# Patient Record
Sex: Male | Born: 1970 | Race: White | Hispanic: No | Marital: Married | Smoking: Never smoker
Health system: Southern US, Community
[De-identification: ages and names within clinical notes are randomized; demographics above are authoritative.]

## PROBLEM LIST (undated history)

## (undated) DIAGNOSIS — C801 Malignant (primary) neoplasm, unspecified: Secondary | ICD-10-CM

## (undated) DIAGNOSIS — K219 Gastro-esophageal reflux disease without esophagitis: Secondary | ICD-10-CM

## (undated) DIAGNOSIS — M1990 Unspecified osteoarthritis, unspecified site: Secondary | ICD-10-CM

## (undated) HISTORY — DX: Malignant (primary) neoplasm, unspecified: C80.1

---

## 2002-07-10 ENCOUNTER — Emergency Department (HOSPITAL_COMMUNITY): Admission: EM | Admit: 2002-07-10 | Discharge: 2002-07-10 | Payer: Self-pay | Admitting: Emergency Medicine

## 2002-07-10 ENCOUNTER — Encounter: Payer: Self-pay | Admitting: Emergency Medicine

## 2002-07-14 ENCOUNTER — Ambulatory Visit (HOSPITAL_COMMUNITY): Admission: RE | Admit: 2002-07-14 | Discharge: 2002-07-14 | Payer: Self-pay | Admitting: Urology

## 2002-07-14 ENCOUNTER — Encounter: Payer: Self-pay | Admitting: Urology

## 2004-10-21 DIAGNOSIS — C801 Malignant (primary) neoplasm, unspecified: Secondary | ICD-10-CM

## 2004-10-21 HISTORY — DX: Malignant (primary) neoplasm, unspecified: C80.1

## 2004-10-21 HISTORY — PX: NEPHRECTOMY: SHX65

## 2005-04-29 ENCOUNTER — Inpatient Hospital Stay (HOSPITAL_COMMUNITY): Admission: RE | Admit: 2005-04-29 | Discharge: 2005-05-05 | Payer: Self-pay | Admitting: Urology

## 2005-04-29 ENCOUNTER — Encounter (INDEPENDENT_AMBULATORY_CARE_PROVIDER_SITE_OTHER): Payer: Self-pay | Admitting: *Deleted

## 2005-08-05 ENCOUNTER — Ambulatory Visit (HOSPITAL_COMMUNITY): Admission: RE | Admit: 2005-08-05 | Discharge: 2005-08-05 | Payer: Self-pay | Admitting: Urology

## 2006-02-04 ENCOUNTER — Ambulatory Visit (HOSPITAL_COMMUNITY): Admission: RE | Admit: 2006-02-04 | Discharge: 2006-02-04 | Payer: Self-pay | Admitting: Urology

## 2007-04-07 ENCOUNTER — Ambulatory Visit (HOSPITAL_COMMUNITY): Admission: RE | Admit: 2007-04-07 | Discharge: 2007-04-07 | Payer: Self-pay | Admitting: Urology

## 2008-08-12 ENCOUNTER — Ambulatory Visit (HOSPITAL_COMMUNITY): Admission: RE | Admit: 2008-08-12 | Discharge: 2008-08-12 | Payer: Self-pay | Admitting: Emergency Medicine

## 2009-04-04 ENCOUNTER — Encounter: Admission: RE | Admit: 2009-04-04 | Discharge: 2009-04-04 | Payer: Self-pay | Admitting: Orthopedic Surgery

## 2010-11-11 ENCOUNTER — Encounter: Payer: Self-pay | Admitting: Emergency Medicine

## 2011-03-08 NOTE — H&P (Signed)
NAMESANTIEL, TOPPER NO.:  [id number]   MEDICAL RECORD NO.:  [id number]          PATIENT TYPE:  INP   LOCATION:  0004                         FACILITY:  [facility]   PHYSICIAN:  Bertram Millard. Dahlstedt, M.D.DATE OF BIRTH:  [birth year]   DATE OF ADMISSION:  04/29/2005  DATE OF DISCHARGE:                                HISTORY & PHYSICAL   CHIEF COMPLAINT:  Right renal tumor.   BRIEF HISTORY:  Brett [name] is a young adult male who originally presented 3 years  ago with right flank pain.  At that point he had a CT scan performed in the  ER which found the patient to have a right upper pole mass 5-6 cm in size,  having characteristics of a simple cyst.  He was reassured and had not been  seen until recently.  He represented with right flank pain and gross  hematuria.  At that time a follow up CT scan revealed a 7 cm, now solid  appearing (very slightly enhancing) right upper pole mass. It was felt that  this was a solid neoplasm.  Metastatic survey was essentially negative. He  did have 1 area in the right renal hilar area which appeared to be a  slightly enlarged lymph node.   He presents, at this time, for a right nephrectomy, and lymph node sampling.  He is aware of risks and complications and desires to proceed.   PAST MEDICAL HISTORY:  Significant for arthroscopic surgery 4 years ago.   MEDICATIONS:  He is not on any medications.   ALLERGIES:  Denies any drug allergies.   SOCIAL HISTORY:  The patient is married and has 1 young child.  His wife is  pregnant with their second.  He denies tobacco or alcohol use.  He is a  native of [place].  He is a Optician, dispensing.   FAMILY HISTORY:  Significant for heart disease, hypertension, prostate  cancer, and diabetes.   REVIEW OF SYSTEMS:  Significant for some right flank pain and intermittent  hematuria, otherwise unremarkable.   PHYSICAL EXAMINATION:  GENERAL:  Exam revealed a robust, pleasant, adult  male.  VITAL SIGNS:  All  normal.  HEENT:  Normal.  NECK:  Supple without thyromegaly or adenopathy.  CHEST:  Clear.  HEART:  Normal rate and rhythm.  ABDOMEN:  Flat, soft, nondistended, nontender.  No masses, no organomegaly.  Bladder not palpable.  He had no CVA tenderness.  No flank masses were  noted.  GENITOURINARY:  Phalanx is normal without lesions, no fibrotic areas or  plaques.  Glans normal.  Meatus normal location and size without blood or  discharge.  Scrotal skin normal.  Testicles normal shape, size and  consistency, distended bilaterally.  Cord and epididymal structures normal.  No inguinal hernias.  No peripheral edema.  NEUROLOGIC EXAM:  Grossly intact.   CBC was normal.  CMET was normal, performed recently at the office.  A CT of  the chest revealed no significant pulmonary nodules, no evidence of  metastatic disease.   IMPRESSION:  A large right upper pole renal mass, suspicious for  carcinoma.   PLAN:  Will admit for right radical nephrectomy, lymph node sampling size.       SMD/MEDQ  D:  04/29/2005  T:  04/29/2005  Job:  [id number]

## 2011-03-08 NOTE — Discharge Summary (Signed)
Brett, [name] NO.:  [id number]   MEDICAL RECORD NO.:  [id number]          PATIENT TYPE:  INP   LOCATION:  1419                         FACILITY:  [facility]   PHYSICIAN:  Brett [name], M.D.DATE OF BIRTH:  05-Oct-1971   DATE OF ADMISSION:  04/29/2005  DATE OF DISCHARGE:  05/05/2005                                 DISCHARGE SUMMARY   ADMISSION DIAGNOSIS:  Right renal mass.   DISCHARGE DIAGNOSIS:  Right renal mass.   PROCEDURE:  Right radical nephrectomy.   SURGEON:  Brett [name], M.D.   CONSULTATIONS:  None.   HISTORY OF PRESENT ILLNESS:  This is a gentleman, who presented initially  with abdominal pain.  He was found to have a right upper pole mass with  characteristics of a simple cyst.  He then represented with gross hematuria  and was seen to have a 7-cm enhancing right upper pole mass, worrisome for  malignancy.  The patient underwent a negative metastatic workup.  However,  there was some lymphadenopathy in the right renal hilar region.  Because of  this, he has elected to undergo open nephroureterectomy with lymph node  dissection.   HOSPITAL COURSE:  On 04/29/2005, the patient was taken to the operating room  and underwent the above-named procedure, which he tolerated without  complication.  For complete description of this procedure, please consult  the operative note.  Postoperatively, he was transferred to the floor, where  he remained throughout his hospital stay, which was uncomplicated, with  progressive toleration of ambulation, diet, and toleration of pain.  On  05/04/2005, it was determined that the patient was in stable condition to be  discharged home.   EXAMINATION AT THE TIME OF DISCHARGE:  ABDOMEN:  Positive bowel sounds,  soft, and nontender to palpation, without  tenderness.  The incision is  clean, dry, and intact.  Staples are in place, without surrounding erythema  or exudate.   For remainder of history and  physical, please consult the admission H&P, as  it is unchanged.   DISCHARGE INSTRUCTIONS:  The patient was instructed not to drive for the  next two weeks or while on narcotic pain medications.  He was instructed to  not strain for the next six weeks.  He was given routine wound care  instructions, and instructed to call or return if he begins to experience  fevers, chills, nausea, vomiting, redness, or drainage from his wound.  He  understands and agrees with these instructions.   DISCHARGE MEDICATIONS:  1.  Vicodin.  2.  Colace.     ______________________________  Glade Nurse, MD      Brett [name], M.D.  Electronically Signed    MT/MEDQ  D:  06/27/2005  T:  06/27/2005  Job:  [id number]

## 2011-03-08 NOTE — Op Note (Signed)
NAMERUFORD, DUDZINSKI NO.:  [id number]   MEDICAL RECORD NO.:  [id number]          PATIENT TYPE:  INP   LOCATION:  0004                         FACILITY:  [facility]   PHYSICIAN:  Bertram Millard. Dahlstedt, M.D.DATE OF BIRTH:  1971/01/07   DATE OF PROCEDURE:  DATE OF DISCHARGE:                                 OPERATIVE REPORT   PREOPERATIVE DIAGNOSES:  1.  Right renal mass.  2.  Right pericaval adenopathy.   POSTOPERATIVE DIAGNOSES:  1.  Right renal mass.  2.  Right pericaval adenopathy.   PROCEDURE:  Right radical nephrectomy.   SURGEON:  Bertram Millard. Dahlstedt, M.D.   ASSISTANT:  Ivette Loyal.   ANESTHESIA:  General endotracheal.   SPECIMENS:  1.  Right kidney and Gerota's fascia.  2.  Right adrenal gland.  3.  Right pericaval node.   DESCRIPTION OF PROCEDURE:  The patient was identified by his wrist bracelet  and brought to room #11 where he received preoperative antibiotics and was  administered general anesthesia.  He was prepped and draped in the usual  sterile fashion.   A 15-cm right subcostal incision was made which was carried down through  dermis and subcutaneous fat with Bovie cautery.  The fascia over the  external oblique was identified and divided from just lateral of the rectus  fascia to the lateral apex of the incision.  We identified and divided the  external oblique, the underlying fascia and muscle body for the internal  oblique, and fascia and underlying muscle body of the transversus abdominis.  The transversalis fascia and peritoneum were identified and opened along the  above-described incision.  Following this, retractors were set, and we  identified the white line of Toldt and resected the right hemicolon.  We  incised this white line from the hepatic flexure down to the inferior  portion of our incision.  We then were able to easily mobilize the colon  medially, and it was packed away.  We next could palpate the kidney and the  upper pole mass.  Following this, we opened Gerota's fascia and bluntly  dissected a plane, leaving adequate perianal fat.  We dissected bluntly, a  plane around the lower pole posterior aspect of the kidney down to the  psoas.  The ureter was identified and ligated with Weck clips and divided  sharply.  We followed the ureter up to the hilum.  We incised the mesotunnel  attachments just lateral to the second portion of the duodenum and dissected  down to the hilum.  The single renal vein was identified and controlled with  a vessel loop.  Following this, we dissected the remainder of the hilum and  found two renal arteries.  These were each ligated with two Weck clips on  the aorta side and one Weck clip on the kidney side.  These were then  divided.  Following this, the renal vein was divided with two silk ties and  a Weck clip and divided sharply.  Now with the kidney being freed at the  hilum, using blunt and sharp dissection, we  dissected out the upper pole and  perirenal fat.  The kidney was then passed off as a specimen.  We then noted  that we had dissected the plane between the kidney and the adrenal gland,  and we noted the remaining adrenal gland.  We then dissected out the adrenal  gland and ligated its artery with Weck clips.  It was divided sharply and  passed off the field for pathologic analysis.  Following this, we palpated  the fossa and the lymph node that had been seen on preoperative imaging that  was noted at the level of the hilum.  It was dissected sharply.  All  vascular and lymph channels were ligated with Weck clips, and the specimen  was passed off the field.  We next inspected the hilum and found excellent  hemostasis.  We did place Surgicel at the hilum for several minutes.  When  reinspected, hemostasis remained excellent.  We then proceeded with the  sponge count which was correct.  We removed all retractors and then  proceeded to close which was done in three  layers.  The posterior sheath was  closed with a running #1 PDS.  Next, the anterior sheath was closed, again  with a running #1 PDS.  The wound was copiously irrigated, and the skin was  closed with staples.  A dressing was applied.   The patient was reversed from his anesthesia which he tolerated without  complication.  Please note, Dr. Marcine Matar was present and  participated in all aspects of this case.       EG/MEDQ  D:  04/29/2005  T:  04/29/2005  Job:  [id number]

## 2012-08-23 ENCOUNTER — Ambulatory Visit (INDEPENDENT_AMBULATORY_CARE_PROVIDER_SITE_OTHER): Payer: BC Managed Care – PPO | Admitting: Physician Assistant

## 2012-08-23 VITALS — BP 130/94 | HR 93 | Temp 98.8°F | Resp 16 | Ht 74.0 in | Wt 285.6 lb

## 2012-08-23 DIAGNOSIS — R509 Fever, unspecified: Secondary | ICD-10-CM

## 2012-08-23 DIAGNOSIS — J029 Acute pharyngitis, unspecified: Secondary | ICD-10-CM

## 2012-08-23 LAB — POCT CBC
Granulocyte percent: 68 %G (ref 37–80)
HCT, POC: 53.1 % (ref 43.5–53.7)
Hemoglobin: 16.7 g/dL (ref 14.1–18.1)
Lymph, poc: 2 (ref 0.6–3.4)
MCH, POC: 27.6 pg (ref 27–31.2)
MCHC: 31.5 g/dL — AB (ref 31.8–35.4)
MCV: 87.7 fL (ref 80–97)
MID (cbc): 0.6 (ref 0–0.9)
MPV: 9.6 fL (ref 0–99.8)
POC Granulocyte: 5.6 (ref 2–6.9)
POC MID %: 7.3 %M (ref 0–12)
Platelet Count, POC: 291 10*3/uL (ref 142–424)
RBC: 6.05 M/uL (ref 4.69–6.13)
WBC: 8.2 10*3/uL (ref 4.6–10.2)

## 2012-08-23 MED ORDER — AMOXICILLIN 875 MG PO TABS: 875.0000 mg | ORAL_TABLET | Freq: Two times a day (BID) | ORAL | Status: DC

## 2012-08-23 MED ORDER — AMBULATORY NON FORMULARY MEDICATION: Status: DC

## 2012-08-23 NOTE — Progress Notes (Signed)
Subjective:    Patient ID: Brett [name], male    DOB: 04-14-1971, 41 y.o.   MRN: [id number]  HPI This 41 y.o. male presents for evaluation of sore throat.  Symptoms began several days ago.  4 of his 5 children have had fever this week, but none of them have sore throat or other symptoms.  Fever, TMax 103, chills.  No GU symptoms.  Loss of appetite, but no nausea, vomiting, diarrhea.  No nasal congestion, drainage or cough.  Known strep exposure.  Review of Systems As above.  Otherwise negative.   Past Medical History  Diagnosis Date  . Cancer 2006    Renal Cell    Past Surgical History  Procedure Date  . Nephrectomy 2006    RIGHT; Renal Cell Carcinoma    Prior to Admission medications   Medication Sig Start Date End Date Taking? Authorizing Provider  Pseudoephedrine-APAP-DM (DAYQUIL PO) Take by mouth.   Yes Historical Provider, MD    No Known Allergies  History   Social History  . Marital Status: Married    Spouse Name: Jacki Cones    Number of Children: 5   Occupational History  . sales and service    Social History Main Topics  . Smoking status: Never Smoker   . Smokeless tobacco: Never Used  . Alcohol Use: No  . Drug Use: No  . Sexually Active: Yes -- Male partner(s)   No family history on file.     Objective:   Physical Exam  Blood pressure 130/94, pulse 93, temperature 98.8 F (37.1 C), temperature source Oral, resp. rate 16, height 6\' 2"  (1.88 m), weight 285 lb 9.6 oz (129.547 kg), SpO2 97.00%. Body mass index is 36.67 kg/(m^2). Well-developed, well nourished WM who is awake, alert and oriented, in NAD. HEENT: [place]/AT, PERRL, EOMI.  Sclera and conjunctiva are clear.  EAC are patent, TMs are normal in appearance. Nasal mucosa is pink and moist. OP is notable for enlarged tonsil on the RIGHT (3+) with exudate, and white patches on the LEFT soft palate.. Neck: supple, non-tender, no lymphadenopathy, thyromegaly. Heart: RRR, no murmur Lungs: normal effort,  CTA Skin: warm and dry without rash. Psychologic: good mood and appropriate affect, normal speech and behavior.  Results for orders placed in visit on 08/23/12  POCT CBC      Component Value Range   WBC 8.2  4.6 - 10.2 K/uL   Lymph, poc 2.0  0.6 - 3.4   POC LYMPH PERCENT 24.7  10 - 50 %L   MID (cbc) 0.6  0 - 0.9   POC MID % 7.3  0 - 12 %M   POC Granulocyte 5.6  2 - 6.9   Granulocyte percent 68.0  37 - 80 %G   RBC 6.05  4.69 - 6.13 M/uL   Hemoglobin 16.7  14.1 - 18.1 g/dL   HCT, POC 40.9  81.1 - 53.7 %   MCV 87.7  80 - 97 fL   MCH, POC 27.6  27 - 31.2 pg   MCHC 31.5 (*) 31.8 - 35.4 g/dL   RDW, POC 91.4     Platelet Count, POC 291  142 - 424 K/uL   MPV 9.6  0 - 99.8 fL   Rapid Strep NEGATIVE.     Assessment & Plan:   1. Pharyngitis  POCT rapid strep A, Culture, Group A Strep, amoxicillin (AMOXIL) 875 MG tablet, AMBULATORY NON FORMULARY MEDICATION  2. Fever  POCT CBC   Supportive care.  Await TC.  Monitor sons for new symptoms.

## 2012-08-23 NOTE — Patient Instructions (Signed)
Get plenty of rest and drink at least 64 ounces of water daily. 

## 2012-08-25 LAB — CULTURE, GROUP A STREP: Organism ID, Bacteria: NORMAL

## 2012-12-05 ENCOUNTER — Other Ambulatory Visit: Payer: Self-pay

## 2013-03-03 ENCOUNTER — Other Ambulatory Visit: Payer: Self-pay | Admitting: Urology

## 2013-03-03 ENCOUNTER — Ambulatory Visit (HOSPITAL_COMMUNITY)
Admission: RE | Admit: 2013-03-03 | Discharge: 2013-03-03 | Disposition: A | Discharge status: Home / Self Care | Payer: BC Managed Care – PPO | Source: Ambulatory Visit | Attending: Urology | Admitting: Urology

## 2013-03-03 DIAGNOSIS — C649 Malignant neoplasm of unspecified kidney, except renal pelvis: Secondary | ICD-10-CM

## 2013-03-06 ENCOUNTER — Encounter: Payer: Self-pay | Admitting: Physician Assistant

## 2013-03-06 DIAGNOSIS — Z85528 Personal history of other malignant neoplasm of kidney: Secondary | ICD-10-CM | POA: Insufficient documentation

## 2013-08-26 ENCOUNTER — Other Ambulatory Visit: Payer: Self-pay

## 2013-11-12 ENCOUNTER — Ambulatory Visit (INDEPENDENT_AMBULATORY_CARE_PROVIDER_SITE_OTHER): Payer: BC Managed Care – PPO | Admitting: Family Medicine

## 2013-11-12 VITALS — BP 146/90 | HR 102 | Temp 98.7°F | Resp 18 | Ht 74.0 in | Wt 295.0 lb

## 2013-11-12 DIAGNOSIS — M542 Cervicalgia: Secondary | ICD-10-CM

## 2013-11-12 MED ORDER — PREDNISONE 20 MG PO TABS: ORAL_TABLET | ORAL | Status: DC

## 2013-11-12 MED ORDER — HYDROCODONE-ACETAMINOPHEN 5-325 MG PO TABS: ORAL_TABLET | ORAL | Status: DC

## 2013-11-12 MED ORDER — METAXALONE 800 MG PO TABS: 800.0000 mg | ORAL_TABLET | Freq: Three times a day (TID) | ORAL | Status: DC

## 2013-11-12 NOTE — Patient Instructions (Signed)
Take the muscle relaxant one half to one 3 times daily  Take the prednisone 3 pills a day and 3 tomorrow for inflammation  Use the pain pills every 4-6 hours if needed for pain  Return if not improving

## 2013-11-12 NOTE — Progress Notes (Signed)
Subjective: Patient has been having problem with left cervical pain for about 10 days. Knows of no trauma. He just woke up one day with it hurting it continues to persist. He is healthy otherwise. He is the father of sons  Objective: Very tender left neck just behind the ear. Pain shoots down to his collarbone area. Good range of motion of, but on side to side tilt and rotation a good deal of pain. Motor function of arms is good.  Assessment: First cervical strain  Plan: Skelaxin 2 days of prednisone (cannot take nonsteroidals) Norco if needed for pain Return if not improving No x-rays are done today

## 2014-03-04 ENCOUNTER — Other Ambulatory Visit: Payer: Self-pay | Admitting: Urology

## 2014-03-04 ENCOUNTER — Ambulatory Visit (HOSPITAL_COMMUNITY)
Admission: RE | Admit: 2014-03-04 | Discharge: 2014-03-04 | Disposition: A | Discharge status: Home / Self Care | Payer: BC Managed Care – PPO | Source: Ambulatory Visit | Attending: Urology | Admitting: Urology

## 2014-03-04 DIAGNOSIS — C649 Malignant neoplasm of unspecified kidney, except renal pelvis: Secondary | ICD-10-CM

## 2014-04-19 ENCOUNTER — Ambulatory Visit (INDEPENDENT_AMBULATORY_CARE_PROVIDER_SITE_OTHER): Payer: BC Managed Care – PPO | Admitting: Family Medicine

## 2014-04-19 VITALS — BP 134/89 | HR 89 | Temp 98.6°F | Resp 16 | Ht 74.0 in | Wt 261.8 lb

## 2014-04-19 DIAGNOSIS — J020 Streptococcal pharyngitis: Secondary | ICD-10-CM

## 2014-04-19 DIAGNOSIS — J029 Acute pharyngitis, unspecified: Secondary | ICD-10-CM

## 2014-04-19 LAB — POCT RAPID STREP A (OFFICE): Rapid Strep A Screen: POSITIVE — AB

## 2014-04-19 MED ORDER — FIRST-DUKES MOUTHWASH MT SUSP: 5.0000 mL | OROMUCOSAL | Status: DC | PRN

## 2014-04-19 MED ORDER — AZITHROMYCIN 250 MG PO TABS: ORAL_TABLET | ORAL | Status: DC

## 2014-04-19 NOTE — Patient Instructions (Addendum)
Strep Throat Strep throat is an infection of the throat caused by a bacteria named Streptococcus pyogenes. Your caregiver may call the infection streptococcal "tonsillitis" or "pharyngitis" depending on whether there are signs of inflammation in the tonsils or back of the throat. Strep throat is most common in children aged 43-15 years during the cold months of the year, but it can occur in people of any age during any season. This infection is spread from person to person (contagious) through coughing, sneezing, or other close contact. SYMPTOMS   Fever or chills.  Painful, swollen, red tonsils or throat.  Pain or difficulty when swallowing.  White or yellow spots on the tonsils or throat.  Swollen, tender lymph nodes or "glands" of the neck or under the jaw.  Red rash all over the body (rare). DIAGNOSIS  Many different infections can cause the same symptoms. A test must be done to confirm the diagnosis so the right treatment can be given. A "rapid strep test" can help your caregiver make the diagnosis in a few minutes. If this test is not available, a light swab of the infected area can be used for a throat culture test. If a throat culture test is done, results are usually available in a day or two. TREATMENT  Strep throat is treated with antibiotic medicine. HOME CARE INSTRUCTIONS   Gargle with 1 tsp of salt in 1 cup of warm water, 3-4 times per day or as needed for comfort.  Family members who also have a sore throat or fever should be tested for strep throat and treated with antibiotics if they have the strep infection.  Make sure everyone in your household washes their hands well.  Do not share food, drinking cups, or personal items that could cause the infection to spread to others.  You may need to eat a soft food diet until your sore throat gets better.  Drink enough water and fluids to keep your urine clear or pale yellow. This will help prevent dehydration.  Get plenty of  rest.  Stay home from school, daycare, or work until you have been on antibiotics for 24 hours.  Only take over-the-counter or prescription medicines for pain, discomfort, or fever as directed by your caregiver.  If antibiotics are prescribed, take them as directed. Finish them even if you start to feel better. SEEK MEDICAL CARE IF:   The glands in your neck continue to enlarge.  You develop a rash, cough, or earache.  You cough up green, yellow-brown, or bloody sputum.  You have pain or discomfort not controlled by medicines.  Your problems seem to be getting worse rather than better. SEEK IMMEDIATE MEDICAL CARE IF:   You develop any new symptoms such as vomiting, severe headache, stiff or painful neck, chest pain, shortness of breath, or trouble swallowing.  You develop severe throat pain, drooling, or changes in your voice.  You develop swelling of the neck, or the skin on the neck becomes red and tender.  You have a fever.  You develop signs of dehydration, such as fatigue, dry mouth, and decreased urination.  You become increasingly sleepy, or you cannot wake up completely. Document Released: 10/04/2000 Document Revised: 09/23/2012 Document Reviewed: 12/06/2010 Exit[employer] Patient Information 2015 [employer]. This information is not intended to replace advice given to you by your health care provider. Make sure you discuss any questions you have with your health care provider.  

## 2014-04-19 NOTE — Progress Notes (Signed)
   Subjective:    Patient ID: Brett [name], male    DOB: [birth year], 43 y.o.   MRN: [id number]  HPI Patient has had 2 day history of sore throat. Feels like he is swallowing gravel. No one else at home sick. Patient has 5 sons, aging in range from 72-10 yo. Patient took ibuprofen last night, slept well.   Patient has lost over 30 pounds since 1/15 by watching carbohydrate intake. Reports that he has more energy.   Review of Systems No fever, no headache, no runny nose, no ear pain, some hoarseness and dry cough.    Objective:   Physical Exam  Vitals reviewed. Constitutional: He is oriented to person, place, and time. He appears well-developed and well-nourished.  HENT:  Head: Normocephalic and atraumatic.  Right Ear: Tympanic membrane, external ear and ear canal normal.  Left Ear: Tympanic membrane, external ear and ear canal normal.  Nose: Rhinorrhea present.  Mouth/Throat: Uvula is midline and mucous membranes are normal. Posterior oropharyngeal edema, posterior oropharyngeal erythema and tonsillar abscesses present. No oropharyngeal exudate.  Right tonsil +3, left tonsil +1.  Eyes: Conjunctivae are normal. Right eye exhibits no discharge. Left eye exhibits no discharge.  Neck: Normal range of motion. Neck supple.  Cardiovascular: Normal rate, regular rhythm and normal heart sounds.   Pulmonary/Chest: Effort normal.  Musculoskeletal: Normal range of motion.  Lymphadenopathy:    He has no cervical adenopathy.  Neurological: He is alert and oriented to person, place, and time.  Skin: Skin is warm and dry.  Psychiatric: He has a normal mood and affect. His behavior is normal. Judgment and thought content normal.    Results for orders placed in visit on 04/19/14  POCT RAPID STREP A (OFFICE)      Result Value Ref Range   Rapid Strep A Screen Positive (*) Negative         Assessment & Plan:  1. Acute pharyngitis, unspecified pharyngitis type - POCT rapid strep A -  Diphenhyd-Hydrocort-Nystatin (FIRST-DUKES MOUTHWASH) SUSP; Use as directed 5 mLs in the mouth or throat every 2 (two) hours as needed.  Dispense: 237 mL; Refill: 0  2. Strep pharyngitis - azithromycin (ZITHROMAX Z-PAK) 250 MG tablet; Take 2 tablets today, then 1 tablet a day for 4 days.  Dispense: 6 each; Refill: 0 -provided written and verbal instructions regarding symptomatic treatment, RTC if no improvement with antibiotic completion or if worsening symptoms. Brett Beck, FNP-BC  [facility]  04/19/2014 1:51 PM

## 2014-08-05 ENCOUNTER — Other Ambulatory Visit: Payer: Self-pay

## 2014-12-16 ENCOUNTER — Ambulatory Visit (INDEPENDENT_AMBULATORY_CARE_PROVIDER_SITE_OTHER): Payer: BLUE CROSS/BLUE SHIELD | Admitting: Internal Medicine

## 2014-12-16 VITALS — BP 130/90 | HR 94 | Temp 97.8°F | Resp 16 | Ht 73.0 in | Wt 274.0 lb

## 2014-12-16 DIAGNOSIS — M5441 Lumbago with sciatica, right side: Secondary | ICD-10-CM

## 2014-12-16 MED ORDER — TRAMADOL HCL 50 MG PO TABS: 50.0000 mg | ORAL_TABLET | Freq: Four times a day (QID) | ORAL | Status: DC | PRN

## 2014-12-16 MED ORDER — PREDNISONE 20 MG PO TABS: ORAL_TABLET | ORAL | Status: DC

## 2014-12-16 MED ORDER — CYCLOBENZAPRINE HCL 10 MG PO TABS: 10.0000 mg | ORAL_TABLET | Freq: Every day | ORAL | Status: DC

## 2014-12-16 NOTE — Progress Notes (Signed)
   Subjective:    Patient ID: Brett [name], male    DOB: Feb 15, 1971, 44 y.o.   MRN: [id number]  HPI worsening lumbar pain over the past 2 weeks after recent travel to [place] Pain mid lumbar and lower lumbar with radiation to the right lower extremity and with pain in the right anterior thigh but no numbness or weakness History of recurrent back problems like this every 1-2 years over the last several years Usually responds temporarily chiropractic Pain may return with some small stresses to the back like certain twists or sitting on airplane ride for long time He's had x-rays but no MRI  History renal cell carcinoma status post surgery one kidney remains and he should avoid NSAIDs Otherwise healthy on no meds Prior history of left shoulder surgery Married--5 boys  Review of Systems No fatigue No fever or night sweats No chest pain or palpitations No GU symptoms No other joint symptoms    Objective:   Physical Exam BP 130/90 mmHg  Pulse 94  Temp(Src) 97.8 F (36.6 C) (Oral)  Resp 16  Ht 6\' 1"  (1.854 m)  Wt 274 lb (124.286 kg)  BMI 36.16 kg/m2  SpO2 98% Overweight Neck has a good range of motion He is nontender along the parathoracic muscles but with some limited range of motion of the shoulders above 90 from general tightness Rotation flexion and extension [place] lumbar discomfort He's tender over L3-4 and 5 and a little to each side in the paralumbar area Nontender over the sciatic notches Straight leg raise on the right is positive at 30 Straight leg raise on the left is positive at 75 Deep tendon reflexes are 2+ at the patella and there is no motor or sensory loss identified       Assessment & Plan:  Acute superimposed on chronic lumbar problems with most likely etiology being chronic muscle strain I discussed the possibility of a central canal stenosis but without weakness or numbness we can proceed with physical therapy without further testing  He needs  therapy aimed at stretching and strengthening and working on the other areas in his hips and spine that provide tightness as a compensation for his lumbar problem  8 d course of prednisone Flexeril at bedtime Ultram if needed Meds ordered this encounter  Medications  . predniSONE (DELTASONE) 20 MG tablet    Sig: 4/4/3/3/2/2/1/1 single daily dose for 8 days    Dispense:  20 tablet    Refill:  0  . cyclobenzaprine (FLEXERIL) 10 MG tablet    Sig: Take 1 tablet (10 mg total) by mouth at bedtime.    Dispense:  30 tablet    Refill:  0  . traMADol (ULTRAM) 50 MG tablet    Sig: Take 1-2 tablets (50-100 mg total) by mouth every 6 (six) hours as needed.    Dispense:  120 tablet    Refill:  0    Refer to [facility]

## 2015-04-12 ENCOUNTER — Ambulatory Visit (INDEPENDENT_AMBULATORY_CARE_PROVIDER_SITE_OTHER): Payer: BLUE CROSS/BLUE SHIELD | Admitting: Physician Assistant

## 2015-04-12 VITALS — BP 132/106 | HR 102 | Temp 97.6°F | Resp 18 | Ht 73.0 in | Wt 279.0 lb

## 2015-04-12 DIAGNOSIS — R030 Elevated blood-pressure reading, without diagnosis of hypertension: Secondary | ICD-10-CM | POA: Diagnosis not present

## 2015-04-12 DIAGNOSIS — Z85528 Personal history of other malignant neoplasm of kidney: Secondary | ICD-10-CM | POA: Diagnosis not present

## 2015-04-12 DIAGNOSIS — R21 Rash and other nonspecific skin eruption: Secondary | ICD-10-CM

## 2015-04-12 DIAGNOSIS — M542 Cervicalgia: Secondary | ICD-10-CM | POA: Diagnosis not present

## 2015-04-12 DIAGNOSIS — IMO0001 Reserved for inherently not codable concepts without codable children: Secondary | ICD-10-CM

## 2015-04-12 MED ORDER — TRAMADOL HCL 50 MG PO TABS
50.0000 mg | ORAL_TABLET | Freq: Four times a day (QID) | ORAL | Status: DC | PRN
Start: 2015-04-12 — End: 2021-02-11

## 2015-04-12 MED ORDER — CLOTRIMAZOLE-BETAMETHASONE 1-0.05 % EX CREA: 1.0000 "application " | TOPICAL_CREAM | Freq: Two times a day (BID) | CUTANEOUS | Status: DC

## 2015-04-12 MED ORDER — PREDNISONE 20 MG PO TABS: ORAL_TABLET | ORAL | Status: DC

## 2015-04-12 MED ORDER — CYCLOBENZAPRINE HCL 10 MG PO TABS: 10.0000 mg | ORAL_TABLET | Freq: Every day | ORAL | Status: DC

## 2015-04-12 NOTE — Progress Notes (Signed)
04/14/2015 at 10:27 AM  Brett [name] / DOB: 1971/01/21 / MRN: [id number]  The patient has History of Renal Cell Carcinoma on his problem list.  SUBJECTIVE  Chief complaint: Neck Pain and Rash   Patient here with chief complaint of left neck.  Started 4 days ago as "a crick in the neck" and has worsened.  Has some radiation of the pain to the left shoulder.  Has tried ibuprofen 600 mg bid and tylenol 500 with good relief of the pain early in the illness, but little to no pain reduction today.  Denies weakness, left hand paraesthesia, and incontinence.   Patient reports that he has a history of white coat hypertension.  He checks his BP at home and reports that it runs around 130/85. He is amenable to checking his BP often and contacting the office if it is running greater than 140/90 consistently.   He complains of a rash on the medial upper thighs.  It started roughly 1 weeks ago and he reports the rash burns somewhat when he is ambulating and sweating.  He has had this problem before. He denies pain with the rash and denies itching.   He  has a past medical history of Cancer (2006).    Medications reviewed and updated by myself where necessary, and exist elsewhere in the encounter.   Brett [name] has No Known Allergies. He  reports that he has never smoked. He has never used smokeless tobacco. He reports that he does not drink alcohol or use illicit drugs. He  reports that he currently engages in sexual activity and has had male partners. The patient  has past surgical history that includes Nephrectomy (2006).  His family history is not on file.  Review of Systems  Constitutional: Negative for fever.  Respiratory: Negative for cough.   Cardiovascular: Negative for chest pain.  Gastrointestinal: Negative for nausea.  Genitourinary: Negative for urgency and frequency.  Musculoskeletal: Negative for myalgias.  Skin: Positive for rash. Negative for itching.  Neurological: Negative for  dizziness and headaches.    OBJECTIVE  His  height is 6\' 1"  (1.854 m) and weight is 279 lb (126.554 kg). His oral temperature is 97.6 F (36.4 C). His blood pressure is 132/106 and his pulse is 102. His respiration is 18 and oxygen saturation is 97%.  The patient's body mass index is 36.82 kg/(m^2).  Physical Exam  Vitals reviewed. Constitutional: He appears well-developed and well-nourished. No distress.  Skin: Skin is warm and dry. Rash noted.       No results found for this or any previous visit (from the past 24 hour(s)).  ASSESSMENT & PLAN  Brett [name] was seen today for neck pain and rash.  Diagnoses and all orders for this visit:  Neck pain on right side Orders: -     cyclobenzaprine (FLEXERIL) 10 MG tablet; Take 1 tablet (10 mg total) by mouth at bedtime. -     predniSONE (DELTASONE) 20 MG tablet; 4/4/3/3/2/2/1/1 single daily dose for 8 days -     traMADol (ULTRAM) 50 MG tablet; Take 1-2 tablets (50-100 mg total) by mouth every 6 (six) hours as needed.  Rash and nonspecific skin eruption: His rash is consistent with mild abrasion, and appears to be secondary to ambulation and large body habitus.  Anticipatory guidance provided.   Orders: -     clotrimazole-betamethasone (LOTRISONE) cream; Apply 1 application topically 2 (two) times daily.  History of renal cell carcinoma Orders: -  Comprehensive metabolic panel  Elevated BP:  Advised patient to come back to clinic if his BP is consistently measuring higher than 140/90.      The patient was advised to call or come back to clinic if he does not see an improvement in symptoms, or worsens with the above plan.   Philis Fendt, MHS, PA-C [facility] 04/14/2015 10:27 AM

## 2015-04-12 NOTE — Patient Instructions (Signed)
Monitor your BP at home and if it is consistently above 140/90 please come in for a visit.

## 2015-04-13 LAB — COMPREHENSIVE METABOLIC PANEL
ALBUMIN: 4.5 g/dL (ref 3.5–5.2)
ALT: 15 U/L (ref 0–53)
AST: 16 U/L (ref 0–37)
Alkaline Phosphatase: 59 U/L (ref 39–117)
BUN: 22 mg/dL (ref 6–23)
CO2: 23 mEq/L (ref 19–32)
Calcium: 9.3 mg/dL (ref 8.4–10.5)
Chloride: 106 mEq/L (ref 96–112)
Creat: 1.2 mg/dL (ref 0.50–1.35)
GLUCOSE: 92 mg/dL (ref 70–99)
Potassium: 4.3 mEq/L (ref 3.5–5.3)
Sodium: 142 mEq/L (ref 135–145)
Total Bilirubin: 0.6 mg/dL (ref 0.2–1.2)
Total Protein: 7.5 g/dL (ref 6.0–8.3)

## 2015-04-17 NOTE — Progress Notes (Signed)
  Medical screening examination/treatment/procedure(s) were performed by non-physician practitioner and as supervising physician I was immediately available for consultation/collaboration.     

## 2015-04-19 ENCOUNTER — Ambulatory Visit (INDEPENDENT_AMBULATORY_CARE_PROVIDER_SITE_OTHER): Payer: BLUE CROSS/BLUE SHIELD | Admitting: Family Medicine

## 2015-04-19 ENCOUNTER — Ambulatory Visit (INDEPENDENT_AMBULATORY_CARE_PROVIDER_SITE_OTHER): Payer: BLUE CROSS/BLUE SHIELD

## 2015-04-19 VITALS — BP 152/94 | HR 91 | Temp 98.5°F | Resp 18 | Ht 73.0 in | Wt 279.0 lb

## 2015-04-19 DIAGNOSIS — M5010 Cervical disc disorder with radiculopathy, unspecified cervical region: Secondary | ICD-10-CM

## 2015-04-19 DIAGNOSIS — R200 Anesthesia of skin: Secondary | ICD-10-CM

## 2015-04-19 DIAGNOSIS — M542 Cervicalgia: Secondary | ICD-10-CM

## 2015-04-19 MED ORDER — METHOCARBAMOL 500 MG PO TABS: 500.0000 mg | ORAL_TABLET | Freq: Three times a day (TID) | ORAL | Status: DC | PRN

## 2015-04-19 NOTE — Patient Instructions (Signed)
Cervical Radiculopathy  Cervical radiculopathy happens when a nerve in the neck is pinched or bruised by a slipped (herniated) disk or by arthritic changes in the bones of the cervical spine. This can occur due to an injury or as part of the normal aging process. Pressure on the cervical nerves can cause pain or numbness that runs from your neck all the way down into your arm and fingers.  CAUSES   There are many possible causes, including:   Injury.   Muscle tightness in the neck from overuse.   Swollen, painful joints (arthritis).   Breakdown or degeneration in the bones and joints of the spine (spondylosis) due to aging.   Bone spurs that may develop near the cervical nerves.  SYMPTOMS   Symptoms include pain, weakness, or numbness in the affected arm and hand. Pain can be severe or irritating. Symptoms may be worse when extending or turning the neck.  DIAGNOSIS   Your caregiver will ask about your symptoms and do a physical exam. He or she may test your strength and reflexes. X-rays, CT scans, and MRI scans may be needed in cases of injury or if the symptoms do not go away after a period of time. Electromyography (EMG) or nerve conduction testing may be done to study how your nerves and muscles are working.  TREATMENT   Your caregiver may recommend certain exercises to help relieve your symptoms. Cervical radiculopathy can, and often does, get better with time and treatment. If your problems continue, treatment options may include:   Wearing a soft collar for short periods of time.   Physical therapy to strengthen the neck muscles.   Medicines, such as nonsteroidal anti-inflammatory drugs (NSAIDs), oral corticosteroids, or spinal injections.   Surgery. Different types of surgery may be done depending on the cause of your problems.  HOME CARE INSTRUCTIONS    Put ice on the affected area.   Put ice in a plastic bag.   Place a towel between your skin and the bag.   Leave the ice on for 15-20 minutes,  03-04 times a day or as directed by your caregiver.   If ice does not help, you can try using heat. Take a warm shower or bath, or use a hot water bottle as directed by your caregiver.   You may try a gentle neck and shoulder massage.   Use a flat pillow when you sleep.   Only take over-the-counter or prescription medicines for pain, discomfort, or fever as directed by your caregiver.   If physical therapy was prescribed, follow your caregiver's directions.   If a soft collar was prescribed, use it as directed.  SEEK IMMEDIATE MEDICAL CARE IF:    Your pain gets much worse and cannot be controlled with medicines.   You have weakness or numbness in your hand, arm, face, or leg.   You have a high fever or a stiff, rigid neck.   You lose bowel or bladder control (incontinence).   You have trouble with walking, balance, or speaking.  MAKE SURE YOU:    Understand these instructions.   Will watch your condition.   Will get help right away if you are not doing well or get worse.  Document Released: 07/02/2001 Document Revised: 12/30/2011 Document Reviewed: 05/21/2011  ExitCare Patient Information 2015 [employer]. This information is not intended to replace advice given to you by your health care provider. Make sure you discuss any questions you have with your health care provider.

## 2015-04-19 NOTE — Progress Notes (Signed)
Chief Complaint:  Chief Complaint  Patient presents with  . Follow-up    neck pain    HPI: Brett [name] is a 44 y.o. male who is here for check of his right neck pain. He was on a sterilely pack, Flexeril, tramadol. His pain is completely gone for the most part. But he has had some numbness. The numbness is only on the front of his shoulder and also along the SCM.  He has no rashes. He has no history of shingles. He has had no fevers or chills. He has no facial swelling or shortness of breath. 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  Past Medical History  Diagnosis Date  . Cancer 2006    Renal Cell   Past Surgical History  Procedure Laterality Date  . Nephrectomy  2006    RIGHT; Renal Cell Carcinoma   History   Social History  . Marital Status: Married    Spouse Name: Margarita Grizzle  . Number of Children: 5  . Years of Education: N/A   Occupational History  . sales and service   . youth leader     [employer]   Social History Main Topics  . Smoking status: Never Smoker   . Smokeless tobacco: Never Used  . Alcohol Use: No  . Drug Use: No  . Sexual Activity:    Partners: Female   Other Topics Concern  . None   Social History Narrative   Lives with his wife and their 5 sons.  His wife home-schools the boys.   No family history on file. No Known Allergies Prior to Admission medications   Medication Sig Start Date End Date Taking? Authorizing Provider  cyclobenzaprine (FLEXERIL) 10 MG tablet Take 1 tablet (10 mg total) by mouth at bedtime. Patient not taking: Reported on 04/19/2015 04/12/15   Tereasa Coop, PA-C  traMADol (ULTRAM) 50 MG tablet Take 1-2 tablets (50-100 mg total) by mouth every 6 (six) hours as needed. Patient not taking: Reported on 04/19/2015 04/12/15   Tereasa Coop, PA-C     ROS: The patient denies fevers, chills, night sweats, unintentional weight loss, chest pain,  palpitations, wheezing, dyspnea on exertion, nausea, vomiting, abdominal pain, dysuria, hematuria, melena, no tingling, no weakness. All other systems have been reviewed and were otherwise negative with the exception of those mentioned in the HPI and as above.    PHYSICAL EXAM: Filed Vitals:   04/19/15 0958  BP: 152/94  Pulse: 91  Temp: 98.5 F (36.9 C)  Resp: 18   Filed Vitals:   04/19/15 0958  Height: 6\' 1"  (1.854 m)  Weight: 279 lb (126.554 kg)   Body mass index is 36.82 kg/(m^2).   General: Alert, no acute distress HEENT:  Normocephalic, atraumatic, oropharynx patent. EOMI, PERRLA Cardiovascular:  Regular rate and rhythm, no rubs murmurs or gallops.  No Carotid bruits, radial pulse intact. No pedal edema.  Respiratory: Clear to auscultation bilaterally.  No wheezes, rales, or rhonchi.  No cyanosis, no use of accessory musculature GI: No organomegaly, abdomen is soft and non-tender, positive bowel sounds.  No masses. Skin: No rashes. Neurologic: Facial musculature symmetric. Psychiatric: Patient is appropriate throughout our interaction. Lymphatic: No cervical lymphadenopathy Musculoskeletal: Gait intact. C spine- + Right SCM paramsk tenderness  On rotation,  Full ROM 5/5 strength, 2/2 DTRs Negative Spurling Shoulder exam was normal 5 out of 5 upper extremity strength  Decreased sensation in C4-C5 dermatome.      LABS: Results for orders placed or performed in visit on 04/12/15  Comprehensive metabolic panel  Result Value Ref Range   Sodium 142 135 - 145 mEq/L   Potassium 4.3 3.5 - 5.3 mEq/L   Chloride 106 96 - 112 mEq/L   CO2 23 19 - 32 mEq/L   Glucose, Bld 92 70 - 99 mg/dL   BUN 22 6 - 23 mg/dL   Creat 1.20 0.50 - 1.35 mg/dL   Total Bilirubin 0.6 0.2 - 1.2 mg/dL   Alkaline Phosphatase 59 39 - 117 U/L   AST 16 0 - 37 U/L   ALT 15 0 - 53 U/L   Total Protein 7.5 6.0 - 8.3 g/dL   Albumin 4.5 3.5 - 5.2 g/dL   Calcium 9.3 8.4 - 10.5 mg/dL     EKG/XRAY:     Primary read interpreted by Dr. Marin Comment at [facility]. + minimal djd , please comment if there is anything at C4-5   ASSESSMENT/PLAN: Encounter Diagnoses  Name Primary?  . Neck pain on right side Yes  . Numbness   . Cervical disc disorder with radiculopathy of cervical region    This is probably just a sprain/strain of his cervical spine muscles. There is probably some inflammation since he has lost the normal curvature of his C-spine. He may  have minimal DJD of the cervical spine. He has C4-C5 cervical dermatome patterns of numbness. There are no other symptoms. We will continue to monitor this. He has decreased pain from this sterile dose pack. He is not even taking Flexeril or tramadol since they're too strong. We will consider switching his Flexeril to Robaxin to see if it has less side effects of dizziness. I have asked him to be mindful that if there is any rashes that occur or if he has worsening pain he needs to let us know because this may be early onset shingles. This is less likely on the differential.  Gross sideeffects, risk and benefits, and alternatives of medications d/w patient. Patient is aware that all medications have potential sideeffects and we are unable to predict every sideeffect or drug-drug interaction that may occur.  Layton Tappan, [place], DO 04/19/2015 4:05 PM

## 2015-08-30 ENCOUNTER — Other Ambulatory Visit: Payer: Self-pay | Admitting: Urology

## 2015-08-30 ENCOUNTER — Ambulatory Visit (HOSPITAL_COMMUNITY)
Admission: RE | Admit: 2015-08-30 | Discharge: 2015-08-30 | Disposition: A | Discharge status: Home / Self Care | Payer: BLUE CROSS/BLUE SHIELD | Source: Ambulatory Visit | Attending: Urology | Admitting: Urology

## 2015-08-30 DIAGNOSIS — Z905 Acquired absence of kidney: Secondary | ICD-10-CM | POA: Insufficient documentation

## 2016-11-29 ENCOUNTER — Other Ambulatory Visit: Payer: Self-pay | Admitting: Orthopedic Surgery

## 2017-04-21 ENCOUNTER — Encounter (HOSPITAL_COMMUNITY): Payer: Self-pay | Admitting: Emergency Medicine

## 2017-04-21 ENCOUNTER — Emergency Department (HOSPITAL_COMMUNITY)
Admission: EM | Admit: 2017-04-21 | Discharge: 2017-04-22 | Disposition: A | Discharge status: Home / Self Care | Payer: BLUE CROSS/BLUE SHIELD | Attending: Emergency Medicine | Admitting: Emergency Medicine

## 2017-04-21 ENCOUNTER — Emergency Department (HOSPITAL_COMMUNITY): Payer: BLUE CROSS/BLUE SHIELD

## 2017-04-21 DIAGNOSIS — Z85528 Personal history of other malignant neoplasm of kidney: Secondary | ICD-10-CM | POA: Diagnosis not present

## 2017-04-21 DIAGNOSIS — R1013 Epigastric pain: Secondary | ICD-10-CM | POA: Diagnosis not present

## 2017-04-21 LAB — CBC WITH DIFFERENTIAL/PLATELET
BASOS PCT: 1 %
Basophils Absolute: 0.1 10*3/uL (ref 0.0–0.1)
Eosinophils Absolute: 0.5 10*3/uL (ref 0.0–0.7)
Eosinophils Relative: 5 %
HEMATOCRIT: 46.1 % (ref 39.0–52.0)
Hemoglobin: 15.9 g/dL (ref 13.0–17.0)
LYMPHS PCT: 32 %
Lymphs Abs: 2.9 10*3/uL (ref 0.7–4.0)
MCH: 28.5 pg (ref 26.0–34.0)
MCHC: 34.5 g/dL (ref 30.0–36.0)
MCV: 82.8 fL (ref 78.0–100.0)
MONO ABS: 0.4 10*3/uL (ref 0.1–1.0)
MONOS PCT: 5 %
NEUTROS ABS: 5.4 10*3/uL (ref 1.7–7.7)
Neutrophils Relative %: 57 %
Platelets: 252 10*3/uL (ref 150–400)
RBC: 5.57 MIL/uL (ref 4.22–5.81)
RDW: 14 % (ref 11.5–15.5)
WBC: 9.2 10*3/uL (ref 4.0–10.5)

## 2017-04-21 LAB — COMPREHENSIVE METABOLIC PANEL
ALT: 18 U/L (ref 17–63)
ANION GAP: 11 (ref 5–15)
AST: 19 U/L (ref 15–41)
Albumin: 4.2 g/dL (ref 3.5–5.0)
Alkaline Phosphatase: 70 U/L (ref 38–126)
BILIRUBIN TOTAL: 0.8 mg/dL (ref 0.3–1.2)
BUN: 18 mg/dL (ref 6–20)
CO2: 25 mmol/L (ref 22–32)
Calcium: 9 mg/dL (ref 8.9–10.3)
Chloride: 106 mmol/L (ref 101–111)
Creatinine, Ser: 1.25 mg/dL — ABNORMAL HIGH (ref 0.61–1.24)
GFR calc Af Amer: >60 mL/min (ref >60)
Glucose, Bld: 114 mg/dL — ABNORMAL HIGH (ref 65–99)
POTASSIUM: 3.6 mmol/L (ref 3.5–5.1)
Sodium: 142 mmol/L (ref 135–145)
TOTAL PROTEIN: 7.4 g/dL (ref 6.5–8.1)

## 2017-04-21 LAB — LIPASE, BLOOD: LIPASE: 33 U/L (ref 11–51)

## 2017-04-21 LAB — I-STAT TROPONIN, ED: TROPONIN I, POC: 0.01 ng/mL (ref 0.00–0.08)

## 2017-04-21 MED ORDER — SODIUM CHLORIDE 0.9 % IV BOLUS (SEPSIS)
1000.0000 mL | Freq: Once | INTRAVENOUS | Status: AC
  Administered 2017-04-21: 1000 mL via INTRAVENOUS

## 2017-04-21 MED ORDER — SUCRALFATE 1 G PO TABS
1.0000 g | ORAL_TABLET | Freq: Once | ORAL | Status: AC
  Administered 2017-04-21: 1 g via ORAL
  Filled 2017-04-21: qty 1

## 2017-04-21 MED ORDER — MORPHINE SULFATE (PF) 4 MG/ML IV SOLN
4.0000 mg | Freq: Once | INTRAVENOUS | Status: AC
  Administered 2017-04-21: 4 mg via INTRAVENOUS
  Filled 2017-04-21: qty 1

## 2017-04-21 MED ORDER — IOPAMIDOL (ISOVUE-300) INJECTION 61%
INTRAVENOUS | Status: AC
  Filled 2017-04-21: qty 100

## 2017-04-21 MED ORDER — HYDROMORPHONE HCL 1 MG/ML IJ SOLN
1.0000 mg | Freq: Once | INTRAMUSCULAR | Status: AC
  Administered 2017-04-21: 1 mg via INTRAVENOUS
  Filled 2017-04-21: qty 1

## 2017-04-21 MED ORDER — ONDANSETRON HCL 4 MG/2ML IJ SOLN
4.0000 mg | Freq: Once | INTRAMUSCULAR | Status: AC
  Administered 2017-04-21: 4 mg via INTRAVENOUS
  Filled 2017-04-21: qty 2

## 2017-04-21 MED ORDER — SIMETHICONE 80 MG PO CHEW
160.0000 mg | CHEWABLE_TABLET | Freq: Once | ORAL | Status: AC
  Administered 2017-04-21: 160 mg via ORAL
  Filled 2017-04-21: qty 2

## 2017-04-21 MED ORDER — GI COCKTAIL ~~LOC~~
30.0000 mL | Freq: Once | ORAL | Status: AC
  Administered 2017-04-21: 30 mL via ORAL
  Filled 2017-04-21: qty 30

## 2017-04-21 MED ORDER — FAMOTIDINE IN NACL 20-0.9 MG/50ML-% IV SOLN
20.0000 mg | INTRAVENOUS | Status: AC
  Administered 2017-04-21: 20 mg via INTRAVENOUS
  Filled 2017-04-21: qty 50

## 2017-04-21 MED ORDER — IOPAMIDOL (ISOVUE-300) INJECTION 61%
100.0000 mL | Freq: Once | INTRAVENOUS | Status: AC | PRN
  Administered 2017-04-21: 100 mL via INTRAVENOUS

## 2017-04-21 NOTE — ED Provider Notes (Signed)
[place] DEPT Provider Note   CSN: [id number] Arrival date & time: 04/21/17  1744     History   Chief Complaint Chief Complaint  Patient presents with  . Abdominal Pain    HPI Brett [name] is a 46 y.o. male.  The history is provided by the patient and medical records.    46 year old male with history of renal cell carcinoma status post right nephrectomy, presenting to the ED with epigastric abdominal pain. States this began a few hours ago and has been fairly constant. States is localized to the lower midsternal region without noted radiation. States pain is sharp and stabbing in nature. States the pain is severe and it makes him feel short of breath, but he denies any trouble actually breathing. He has not had any chest pain, cough, congestion, fever, chills. States he had similar symptoms in the past related to acid reflux. He did try taking some Zantac) drinking cup of water with baking soda at home which made him throw up. States she has tried to vomit again as he thinks he would feel better he vomited. He denied any recent changes in diet or abnormal food intake, states he quit drinking soda back in December. He denies any other symptoms at this time.  States when he had similar symptoms in the past GI cocktail resolved it, he had this in triage without change in symptoms.  Past Medical History:  Diagnosis Date  . Cancer [facility]) 2006   Renal Cell    Patient Active Problem List   Diagnosis Date Noted  . History of Renal Cell Carcinoma 03/06/2013    Past Surgical History:  Procedure Laterality Date  . NEPHRECTOMY  2006   RIGHT; Renal Cell Carcinoma       Home Medications    Prior to Admission medications   Medication Sig Start Date End Date Taking? Authorizing Provider  methocarbamol (ROBAXIN) 500 MG tablet Take 1 tablet (500 mg total) by mouth every 8 (eight) hours as needed for muscle spasms. 04/19/15   Le, Thao P, DO  traMADol (ULTRAM) 50 MG tablet Take 1-2  tablets (50-100 mg total) by mouth every 6 (six) hours as needed. Patient not taking: Reported on 04/19/2015 04/12/15   Tereasa Coop, PA-C    Family History No family history on file.  Social History Social History  Substance Use Topics  . Smoking status: Never Smoker  . Smokeless tobacco: Never Used  . Alcohol use No     Allergies   Patient has no known allergies.   Review of Systems Review of Systems  Gastrointestinal: Positive for abdominal pain, nausea and vomiting.  All other systems reviewed and are negative.    Physical Exam Updated Vital Signs BP (!) 183/119 (BP Location: Right Arm)   Pulse 76   Temp 98.8 F (37.1 C) (Oral)   Resp 20   SpO2 99%   Physical Exam  Constitutional: He is oriented to person, place, and time. He appears well-developed and well-nourished.  Appears uncomfortable, diaphoretic  HENT:  Head: Normocephalic and atraumatic.  Mouth/Throat: Oropharynx is clear and moist.  Eyes: Conjunctivae and EOM are normal. Pupils are equal, round, and reactive to light.  Neck: Normal range of motion.  Cardiovascular: Normal rate, regular rhythm and normal heart sounds.   Pulmonary/Chest: Effort normal and breath sounds normal. No respiratory distress. He has no wheezes. He has no rhonchi.    Endorses pain in lower mid-sternal region, no focal tenderness  Abdominal: Soft. Bowel sounds  are normal. There is no tenderness. There is no rebound.  Musculoskeletal: Normal range of motion.  Neurological: He is alert and oriented to person, place, and time.  Skin: Skin is warm.  Psychiatric: He has a normal mood and affect.  Nursing note and vitals reviewed.    ED Treatments / Results  Labs (all labs ordered are listed, but only abnormal results are displayed) Labs Reviewed  COMPREHENSIVE METABOLIC PANEL - Abnormal; Notable for the following:       Result Value   Glucose, Bld 114 (*)    Creatinine, Ser 1.25 (*)    All other components within  normal limits  CBC WITH DIFFERENTIAL/PLATELET  LIPASE, BLOOD  I-STAT TROPOININ, ED    EKG  EKG Interpretation  Date/Time:  Monday April 21 2017 17:50:59 EDT Ventricular Rate:  83 PR Interval:  156 QRS Duration: 92 QT Interval:  388 QTC Calculation: 455 R Axis:   -4 Text Interpretation:  Normal sinus rhythm with sinus arrhythmia Minimal voltage criteria for LVH, may be normal variant Septal infarct , age undetermined Abnormal ECG Confirmed by Veryl Speak [phone]) on 04/21/2017 6:23:05 PM       Radiology Dg Chest 2 View  Result Date: 04/21/2017 CLINICAL DATA:  Epigastric pain for 2 hours. EXAM: CHEST  2 VIEW COMPARISON:  08/30/2015 FINDINGS: The cardiomediastinal contours are normal. The lungs are clear. Pulmonary vasculature is normal. No consolidation, pleural effusion, or pneumothorax. No acute osseous abnormalities are seen. Gaseous gastric distention in the upper abdomen is noted. IMPRESSION: 1. Clear lungs. 2. Gaseous gastric distention in the upper abdomen. Electronically Signed   By: Jeb Levering M.D.   On: 04/21/2017 19:03   Ct Abdomen Pelvis W Contrast  Result Date: 04/21/2017 CLINICAL DATA:  Epigastric pain and distention.  Fever and vomiting. EXAM: CT ABDOMEN AND PELVIS WITH CONTRAST TECHNIQUE: Multidetector CT imaging of the abdomen and pelvis was performed using the standard protocol following bolus administration of intravenous contrast. CONTRAST:  181mL ISOVUE-300 IOPAMIDOL (ISOVUE-300) INJECTION 61% COMPARISON:  None. FINDINGS: Lower chest: No pericardial effusion. The visualized cardiac chambers are unremarkable. There is atelectasis at each lung base. No effusion or pneumothorax. Hepatobiliary: Hepatic steatosis. Physiologically distended gallbladder with gallstones. No secondary signs of acute cholecystitis. No biliary dilatation. No space-occupying mass of the liver. Pancreas: Unremarkable. No pancreatic ductal dilatation or surrounding inflammatory changes. Spleen:  Normal in size without focal abnormality. Adrenals/Urinary Tract: Adrenal glands are unremarkable. Right nephrectomy. Compensatory hypertrophy of the left kidney without focal mass or obstructive uropathy. The urinary bladder is unremarkable. Stomach/Bowel: Physiologically distended stomach. Normal small bowel rotation without dilatation. Normal appendix. No bowel inflammation. There is descending and sigmoid diverticulosis without acute diverticulitis. Vascular/Lymphatic: No significant vascular findings are present. No enlarged abdominal or pelvic lymph nodes. Reproductive: Prostate is unremarkable. Other: No abdominal wall hernia or abnormality. No abdominopelvic ascites. Musculoskeletal: Degenerative subcortical cysts of the humeral heads bilaterally. No suspicious lytic or blastic disease. IMPRESSION: 1. Left-sided colonic diverticulosis without acute diverticulitis. 2. Mild hepatic steatosis with uncomplicated cholelithiasis. 3. Right nephrectomy.  No evidence metastatic disease. 4. No bowel obstruction. Electronically Signed   By: Ashley Royalty M.D.   On: 04/21/2017 21:23    Procedures Procedures (including critical care time)  Medications Ordered in ED Medications  iopamidol (ISOVUE-300) 61 % injection (not administered)  gi cocktail (Maalox,Lidocaine,Donnatal) (30 mLs Oral Given 04/21/17 1808)  famotidine (PEPCID) IVPB 20 mg premix (0 mg Intravenous Stopped 04/21/17 1925)  sucralfate (CARAFATE) tablet 1 g (1 g Oral  Given 04/21/17 1845)  sodium chloride 0.9 % bolus 1,000 mL (0 mLs Intravenous Stopped 04/21/17 2027)  ondansetron (ZOFRAN) injection 4 mg (4 mg Intravenous Given 04/21/17 1842)  morphine 4 MG/ML injection 4 mg (4 mg Intravenous Given 04/21/17 1843)  HYDROmorphone (DILAUDID) injection 1 mg (1 mg Intravenous Given 04/21/17 1936)  iopamidol (ISOVUE-300) 61 % injection 100 mL (100 mLs Intravenous Contrast Given 04/21/17 2046)  simethicone (MYLICON) chewable tablet 160 mg (160 mg Oral Given 04/21/17  2229)     Initial Impression / Assessment and Plan / ED Course  I have reviewed the triage vital signs and the nursing notes.  Pertinent labs & imaging results that were available during my care of the patient were reviewed by me and considered in my medical decision making (see chart for details).  46 year old male here with epigastric pain. Reports history of similar in the past that resolved with GI cocktail. He denies any chest pain at present. Patient is diaphoretic, appears very uncomfortable. He is clutching his upper abdomen on exam. Reports he does feel some distention and as if his stomach may "pop".  We'll plan for labs, chest x-ray. Medications ordered.  Labwork overall reassuring. Chest x-ray with some gastric distention noted. Patient has not had any response to medications. Additional medications ordered. Will proceed with CT scan.  CT scan without evidence of acute findings, there is some distention of the stomach but no evidence of bowel obstruction or free air. Patient's symptoms may very well be due to gas pains. Have ordered additional simethicone.    12:24 AM Patient has gotten up, moved around, changed positions, and drinking soda and has belched a few times it is feeling somewhat better after simethicone. He still feels some extension of the upper abdomen, but not as severe as before.  Patient's vitals remained stable. His overall appearance is vastly improved from initial evaluation. At this point given reassuring labs and fairly negative CT, feel he is stable for discharge home. I recommended that he continue over-the-counter Gas-X, Mylanta, Pepto, etc. As needed for symptoms.  He was given GI follow-up if symptoms persist. Return options were discussed with patient his wife at bedside, they acknowledged understanding and agreed with plan of care.   Final Clinical Impressions(s) / ED Diagnoses   Final diagnoses:  Epigastric pain    New Prescriptions New Prescriptions    No medications on file     Larene Pickett, PA-C 04/22/17 0036    Veryl Speak, MD 04/23/17 [phone]

## 2017-04-21 NOTE — ED Triage Notes (Addendum)
Pt to ED with c/o epigastric pain onset approx 2 hours ago.  Pt st;s he has had similar pain in the past that a GI cocktail took the pain away.  Pt has vomited x's one.  GI cocktail given in triage without relief

## 2017-04-22 NOTE — Discharge Instructions (Signed)
Would recommend to continue taking Gas-X, mylanta, pepto, etc. For continued pain.  May wish to try some carbonated beverages to help with gas pain/distention Follow-up with GI if any ongoing issues-- can call to make an appt. Return to the ED for new or worsening symptoms.

## 2017-06-24 ENCOUNTER — Other Ambulatory Visit: Payer: Self-pay | Admitting: Orthopedic Surgery

## 2017-06-24 DIAGNOSIS — M25512 Pain in left shoulder: Secondary | ICD-10-CM

## 2017-07-09 ENCOUNTER — Ambulatory Visit
Admission: RE | Admit: 2017-07-09 | Discharge: 2017-07-09 | Disposition: A | Discharge status: Home / Self Care | Payer: BLUE CROSS/BLUE SHIELD | Source: Ambulatory Visit | Attending: Orthopedic Surgery | Admitting: Orthopedic Surgery

## 2017-07-09 DIAGNOSIS — M25512 Pain in left shoulder: Secondary | ICD-10-CM

## 2017-10-15 ENCOUNTER — Other Ambulatory Visit: Payer: Self-pay | Admitting: Gastroenterology

## 2017-10-15 ENCOUNTER — Ambulatory Visit
Admission: RE | Admit: 2017-10-15 | Discharge: 2017-10-15 | Disposition: A | Discharge status: Home / Self Care | Payer: BLUE CROSS/BLUE SHIELD | Source: Ambulatory Visit | Attending: Gastroenterology | Admitting: Gastroenterology

## 2017-10-15 DIAGNOSIS — R1084 Generalized abdominal pain: Secondary | ICD-10-CM

## 2020-05-12 ENCOUNTER — Other Ambulatory Visit: Payer: Self-pay | Admitting: Gastroenterology

## 2020-05-12 DIAGNOSIS — R112 Nausea with vomiting, unspecified: Secondary | ICD-10-CM

## 2020-05-17 ENCOUNTER — Other Ambulatory Visit: Payer: Self-pay

## 2020-05-17 ENCOUNTER — Other Ambulatory Visit: Payer: Self-pay | Admitting: Gastroenterology

## 2020-05-17 ENCOUNTER — Other Ambulatory Visit (HOSPITAL_BASED_OUTPATIENT_CLINIC_OR_DEPARTMENT_OTHER): Payer: Self-pay | Admitting: Gastroenterology

## 2020-05-17 ENCOUNTER — Ambulatory Visit
Admission: RE | Admit: 2020-05-17 | Discharge: 2020-05-17 | Disposition: A | Discharge status: Home / Self Care | Payer: BLUE CROSS/BLUE SHIELD | Source: Ambulatory Visit | Attending: Gastroenterology | Admitting: Gastroenterology

## 2020-05-17 ENCOUNTER — Encounter (HOSPITAL_BASED_OUTPATIENT_CLINIC_OR_DEPARTMENT_OTHER): Payer: Self-pay

## 2020-05-17 ENCOUNTER — Ambulatory Visit (HOSPITAL_BASED_OUTPATIENT_CLINIC_OR_DEPARTMENT_OTHER)
Admission: RE | Admit: 2020-05-17 | Discharge: 2020-05-17 | Disposition: A | Discharge status: Home / Self Care | Payer: BLUE CROSS/BLUE SHIELD | Source: Ambulatory Visit | Attending: Gastroenterology | Admitting: Gastroenterology

## 2020-05-17 ENCOUNTER — Other Ambulatory Visit (HOSPITAL_COMMUNITY): Payer: Self-pay | Admitting: Gastroenterology

## 2020-05-17 DIAGNOSIS — K861 Other chronic pancreatitis: Secondary | ICD-10-CM | POA: Diagnosis present

## 2020-05-17 DIAGNOSIS — R112 Nausea with vomiting, unspecified: Secondary | ICD-10-CM

## 2020-05-17 MED ORDER — IOHEXOL 300 MG/ML  SOLN
100.0000 mL | Freq: Once | INTRAMUSCULAR | Status: AC | PRN
  Administered 2020-05-17: 13:00:00 100 mL via INTRAVENOUS

## 2020-05-21 HISTORY — PX: CHOLECYSTECTOMY: SHX55

## 2020-08-31 ENCOUNTER — Encounter: Payer: Self-pay | Admitting: Emergency Medicine

## 2020-08-31 ENCOUNTER — Other Ambulatory Visit: Payer: Self-pay

## 2020-08-31 ENCOUNTER — Ambulatory Visit
Admission: EM | Admit: 2020-08-31 | Discharge: 2020-08-31 | Disposition: A | Discharge status: Home / Self Care | Payer: BLUE CROSS/BLUE SHIELD | Attending: Emergency Medicine | Admitting: Emergency Medicine

## 2020-08-31 DIAGNOSIS — S0181XA Laceration without foreign body of other part of head, initial encounter: Secondary | ICD-10-CM

## 2020-08-31 DIAGNOSIS — Z23 Encounter for immunization: Secondary | ICD-10-CM | POA: Diagnosis not present

## 2020-08-31 MED ORDER — TETANUS-DIPHTH-ACELL PERTUSSIS 5-2.5-18.5 LF-MCG/0.5 IM SUSY
0.5000 mL | PREFILLED_SYRINGE | Freq: Once | INTRAMUSCULAR | Status: AC
  Administered 2020-08-31: 0.5 mL via INTRAMUSCULAR

## 2020-08-31 NOTE — ED Provider Notes (Signed)
Emergency Department Provider Note  ____________________________________________  Time seen: Approximately 1:49 PM  I have reviewed the triage vital signs and the nursing notes.   HISTORY  Chief Complaint Laceration   Historian Patient     HPI Brett [name] is a 49 y.o. male presents to the urgent care with a 3 cm superficial vertical laceration along forehead after patient lost grip on a metal pipe.  Patient did not lose consciousness.  No neck pain.  No numbness or tingling in the upper and lower extremities.  Patient denies chest pain, chest tightness or abdominal pain.  States it has been 7 years since his last tetanus shot.    Past Medical History:  Diagnosis Date  . Cancer [facility]) 2006   Renal Cell     Immunizations up to date:  Yes.     Past Medical History:  Diagnosis Date  . Cancer [facility]) 2006   Renal Cell    Patient Active Problem List   Diagnosis Date Noted  . History of Renal Cell Carcinoma 03/06/2013    Past Surgical History:  Procedure Laterality Date  . NEPHRECTOMY  2006   RIGHT; Renal Cell Carcinoma    Prior to Admission medications   Medication Sig Start Date End Date Taking? Authorizing Provider  methocarbamol (ROBAXIN) 500 MG tablet Take 1 tablet (500 mg total) by mouth every 8 (eight) hours as needed for muscle spasms. Patient not taking: Reported on 04/21/2017 04/19/15   Le, Thao P, DO  phenylephrine (SUDAFED PE) 10 MG TABS tablet Take 10 mg by mouth every 4 (four) hours as needed (for congestion).    [provider]  ranitidine (ZANTAC) 150 MG tablet Take 150 mg by mouth daily as needed for heartburn.    [provider]  traMADol (ULTRAM) 50 MG tablet Take 1-2 tablets (50-100 mg total) by mouth every 6 (six) hours as needed. Patient not taking: Reported on 04/19/2015 04/12/15   Tereasa Coop, PA-C    Allergies Patient has no known allergies.  Family History  Problem Relation Age of Onset  . Healthy Mother      Social History Social History   Tobacco Use  . Smoking status: Never Smoker  . Smokeless tobacco: Never Used  Substance Use Topics  . Alcohol use: No  . Drug use: No     Review of Systems  Constitutional: No fever/chills Eyes:  No discharge ENT: No upper respiratory complaints. Respiratory: no cough. No SOB/ use of accessory muscles to breath Gastrointestinal:   No nausea, no vomiting.  No diarrhea.  No constipation. Musculoskeletal: Negative for musculoskeletal pain. Skin: Patient has laceration.     ____________________________________________   PHYSICAL EXAM:  VITAL SIGNS: ED Triage Vitals  Enc Vitals Group     BP 08/31/20 1335 (!) 181/114     Pulse Rate 08/31/20 1335 91     Resp 08/31/20 1335 18     Temp 08/31/20 1335 98.1 F (36.7 C)     Temp Source 08/31/20 1335 Oral     SpO2 08/31/20 1335 97 %     Weight --      Height --      Head Circumference --      Peak Flow --      Pain Score 08/31/20 1338 5     Pain Loc --      Pain Edu? --      Excl. in [place]? --      Constitutional: Alert and oriented. Well appearing and  in no acute distress. Eyes: Conjunctivae are normal. PERRL. EOMI. Head: Atraumatic.  Patient has 3 cm linear,superficial vertical forehead laceration. ENT:      Ears: TMs are pearly.       Nose: No congestion/rhinnorhea.      Mouth/Throat: Mucous membranes are moist.  Neck: No stridor.  No cervical spine tenderness to palpation. Cardiovascular: Normal rate, regular rhythm. Normal S1 and S2.  Good peripheral circulation. Respiratory: Normal respiratory effort without tachypnea or retractions. Lungs CTAB. Good air entry to the bases with no decreased or absent breath sounds Gastrointestinal: Bowel sounds x 4 quadrants. Soft and nontender to palpation. No guarding or rigidity. No distention. Musculoskeletal: Full range of motion to all extremities. No obvious deformities noted Neurologic:  Normal for age. No gross focal neurologic deficits  are appreciated.  Skin:  Skin is warm, dry and intact. No rash noted. Psychiatric: Mood and affect are normal for age. Speech and behavior are normal.   ____________________________________________   LABS (all labs ordered are listed, but only abnormal results are displayed)  Labs Reviewed - No data to display ____________________________________________  EKG   ____________________________________________  RADIOLOGY   No results found.  ____________________________________________    PROCEDURES  Procedure(s) performed:     Marland KitchenMarland KitchenLaceration Repair  Date/Time: 08/31/2020 5:42 PM Performed by: Lannie Fields, PA-C Authorized by: Lannie Fields, PA-C   Consent:    Consent obtained:  Verbal   Consent given by:  Patient Anesthesia (see MAR for exact dosages):    Anesthesia method:  None Laceration details:    Location:  Face   Face location:  Forehead   Length (cm):  3 Repair type:    Repair type:  Simple Exploration:    Contaminated: no   Treatment:    Amount of cleaning:  Standard   Irrigation solution:  Sterile saline   Visualized foreign bodies/material removed: no   Skin repair:    Repair method:  Tissue adhesive Approximation:    Approximation:  Close Post-procedure details:    Dressing:  Open (no dressing)   Patient tolerance of procedure:  Tolerated well, no immediate complications       Medications  Tdap (BOOSTRIX) injection 0.5 mL (0.5 mLs Intramuscular Given 08/31/20 1344)     ____________________________________________   INITIAL IMPRESSION / ASSESSMENT AND PLAN / ED COURSE  Pertinent labs & imaging results that were available during my care of the patient were reviewed by me and considered in my medical decision making (see chart for details).     Assessment and plan Facial laceration 49 year old male presents to the emergency department with a superficial facial laceration after hitting his head against a metal pipe.  Patient  was hypertensive at triage but vital signs otherwise reassuring.  Had no neuro deficits noted.  Patient's laceration was repaired with Dermabond and tetanus status was updated in urgent care.  Return precautions were given to return with new or worsening symptoms.  All patient questions were answered.     ____________________________________________  FINAL CLINICAL IMPRESSION(S) / ED DIAGNOSES  Final diagnoses:  Facial laceration, initial encounter      NEW MEDICATIONS STARTED DURING THIS VISIT:  ED Discharge Orders    None          This chart was dictated using voice recognition software/Dragon. Despite best efforts to proofread, errors can occur which can change the meaning. Any change was purely unintentional.     Lannie Fields, PA-C 08/31/20 1744

## 2020-08-31 NOTE — ED Triage Notes (Signed)
Pt here for laceration to forehead after hitting with pipe today; bleeding controlled at present

## 2021-02-09 ENCOUNTER — Other Ambulatory Visit: Payer: Self-pay

## 2021-02-09 ENCOUNTER — Ambulatory Visit
Admission: EM | Admit: 2021-02-09 | Discharge: 2021-02-09 | Disposition: A | Discharge status: Home / Self Care | Payer: BLUE CROSS/BLUE SHIELD | Attending: Family Medicine | Admitting: Family Medicine

## 2021-02-09 DIAGNOSIS — R1030 Lower abdominal pain, unspecified: Secondary | ICD-10-CM

## 2021-02-09 MED ORDER — KETOROLAC TROMETHAMINE 60 MG/2ML IM SOLN
60.0000 mg | Freq: Once | INTRAMUSCULAR | Status: AC
  Administered 2021-02-09: 60 mg via INTRAMUSCULAR

## 2021-02-09 NOTE — ED Triage Notes (Signed)
Pt present pain in his stomach, pt states its more of his hernia that is painful. Symptoms started two days ago.

## 2021-02-09 NOTE — Discharge Instructions (Addendum)
Go to the ER if your symptoms worsen at any point in time.  Call [facility] surgery and see if they can see you in the next few days for further evaluation.

## 2021-02-10 LAB — CBC WITH DIFFERENTIAL/PLATELET
Basophils Absolute: 0.1 10*3/uL (ref 0.0–0.2)
Basos: 1 %
EOS (ABSOLUTE): 0.4 10*3/uL (ref 0.0–0.4)
Eos: 4 %
Hematocrit: 48.2 % (ref 37.5–51.0)
Hemoglobin: 16.3 g/dL (ref 13.0–17.7)
Immature Grans (Abs): 0 10*3/uL (ref 0.0–0.1)
Immature Granulocytes: 0 %
Lymphocytes Absolute: 2.1 10*3/uL (ref 0.7–3.1)
Lymphs: 22 %
MCH: 28.3 pg (ref 26.6–33.0)
MCHC: 33.8 g/dL (ref 31.5–35.7)
MCV: 84 fL (ref 79–97)
Monocytes Absolute: 0.9 10*3/uL (ref 0.1–0.9)
Monocytes: 10 %
Neutrophils Absolute: 6.2 10*3/uL (ref 1.4–7.0)
Neutrophils: 63 %
Platelets: 276 10*3/uL (ref 150–450)
RBC: 5.76 x10E6/uL (ref 4.14–5.80)
RDW: 13.9 % (ref 11.6–15.4)
WBC: 9.7 10*3/uL (ref 3.4–10.8)

## 2021-02-10 LAB — COMPREHENSIVE METABOLIC PANEL
ALT: 30 IU/L (ref 0–44)
AST: 19 IU/L (ref 0–40)
Albumin/Globulin Ratio: 1.5 (ref 1.2–2.2)
Albumin: 4.5 g/dL (ref 4.0–5.0)
Alkaline Phosphatase: 83 IU/L (ref 44–121)
BUN/Creatinine Ratio: 14 (ref 9–20)
BUN: 17 mg/dL (ref 6–24)
Bilirubin Total: 0.9 mg/dL (ref 0.0–1.2)
CO2: 18 mmol/L — ABNORMAL LOW (ref 20–29)
Calcium: 9.2 mg/dL (ref 8.7–10.2)
Chloride: 104 mmol/L (ref 96–106)
Creatinine, Ser: 1.21 mg/dL (ref 0.76–1.27)
Globulin, Total: 3 g/dL (ref 1.5–4.5)
Glucose: 99 mg/dL (ref 65–99)
Potassium: 4.5 mmol/L (ref 3.5–5.2)
Sodium: 137 mmol/L (ref 134–144)
Total Protein: 7.5 g/dL (ref 6.0–8.5)
eGFR: 73 mL/min/{1.73_m2} (ref >59)

## 2021-02-11 ENCOUNTER — Encounter (HOSPITAL_BASED_OUTPATIENT_CLINIC_OR_DEPARTMENT_OTHER): Payer: Self-pay | Admitting: Emergency Medicine

## 2021-02-11 ENCOUNTER — Emergency Department (HOSPITAL_BASED_OUTPATIENT_CLINIC_OR_DEPARTMENT_OTHER): Payer: BLUE CROSS/BLUE SHIELD

## 2021-02-11 ENCOUNTER — Emergency Department (HOSPITAL_BASED_OUTPATIENT_CLINIC_OR_DEPARTMENT_OTHER)
Admission: EM | Admit: 2021-02-11 | Discharge: 2021-02-11 | Disposition: A | Discharge status: Home / Self Care | Payer: BLUE CROSS/BLUE SHIELD | Attending: Emergency Medicine | Admitting: Emergency Medicine

## 2021-02-11 ENCOUNTER — Other Ambulatory Visit: Payer: Self-pay

## 2021-02-11 DIAGNOSIS — K439 Ventral hernia without obstruction or gangrene: Secondary | ICD-10-CM | POA: Diagnosis not present

## 2021-02-11 DIAGNOSIS — Z85528 Personal history of other malignant neoplasm of kidney: Secondary | ICD-10-CM | POA: Diagnosis not present

## 2021-02-11 DIAGNOSIS — R1033 Periumbilical pain: Secondary | ICD-10-CM | POA: Diagnosis present

## 2021-02-11 MED ORDER — HYDROCODONE-ACETAMINOPHEN 5-325 MG PO TABS
2.0000 | ORAL_TABLET | Freq: Four times a day (QID) | ORAL | 0 refills | Status: DC | PRN
Start: 2021-02-11 — End: 2021-04-18

## 2021-02-11 NOTE — Discharge Instructions (Signed)
Try using the elastic band while you are at work to help decrease the abdominal pressure.  It is fine for you to use Tylenol if the pain is manageable.  If you start having recurrent vomiting, not having bowel movements and the pain becomes severe please return to the emergency room.

## 2021-02-11 NOTE — ED Triage Notes (Signed)
Pt c/o abdominal pain onset last week. Pt reports that he has been doing a lot of sit up type movement at work. Pt reports that he went to urgent care for evaluation.

## 2021-02-11 NOTE — ED Provider Notes (Signed)
[place] EMERGENCY DEPT Provider Note   CSN: [id number] Arrival date & time: 02/11/21  6314     History Chief Complaint  Patient presents with  . Abdominal Pain    Brett [name] is a 50 y.o. male.  Patient is a 50 year old male with a history of renal cell carcinoma status post nephrectomy, status post cholecystectomy with eosinophilic esophagitis who is presenting today with abdominal pain.  Patient reports he noticed the pain approximately 2 weeks ago.  It is in the midline of his abdomen just proximal to his cholecystectomy scar.  It felt like a hard knot that was painful.  At work he has been doing a lot of work where he has to lay down and sit up which he says ultimately is like doing crunches.  This has gradually worsened the pain.  He played golf with his children a few days ago which made the pain even worse so he went to urgent care on 02/09/2021 and at that time had normal blood work.  He was given a shot of Toradol and reports that did significantly improve his pain however the pain came back at about 2 AM when the shot wore off.  He has been taking Tylenol for the pain and reports currently the pain is a 6 out of 10.  It is worse with twisting, sitting up and movements.  It is not particularly worse with eating.  He has had no nausea vomiting or bowel changes.  Because of the ongoing pain he just wanted to make sure what was going on.  The history is provided by the patient and medical records.  Abdominal Pain      Past Medical History:  Diagnosis Date  . Cancer [facility]) 2006   Renal Cell    Patient Active Problem List   Diagnosis Date Noted  . History of Renal Cell Carcinoma 03/06/2013    Past Surgical History:  Procedure Laterality Date  . CHOLECYSTECTOMY    . NEPHRECTOMY  2006   RIGHT; Renal Cell Carcinoma       Family History  Problem Relation Age of Onset  . Healthy Mother     Social History   Tobacco Use  . Smoking status: Never Smoker   . Smokeless tobacco: Never Used  Vaping Use  . Vaping Use: Never used  Substance Use Topics  . Alcohol use: No  . Drug use: No    Home Medications Prior to Admission medications   Medication Sig Start Date End Date Taking? Authorizing Provider  pantoprazole (PROTONIX) 40 MG tablet Take 1 tablet by mouth every morning. 01/08/21   [provider]    Allergies    Other  Review of Systems   Review of Systems  Gastrointestinal: Positive for abdominal pain.  All other systems reviewed and are negative.   Physical Exam Updated Vital Signs BP (!) 140/99 (BP Location: Right Arm)   Pulse 76   Temp 98.4 F (36.9 C) (Oral)   Resp 14   Ht 6\' 2"  (1.88 m)   Wt 133.8 kg   SpO2 98%   BMI 37.88 kg/m   Physical Exam Vitals and nursing note reviewed.  Constitutional:      General: He is not in acute distress.    Appearance: He is well-developed.  HENT:     Head: Normocephalic and atraumatic.  Eyes:     Conjunctiva/sclera: Conjunctivae normal.     Pupils: Pupils are equal, round, and reactive to light.  Cardiovascular:  Rate and Rhythm: Normal rate.  Pulmonary:     Effort: Pulmonary effort is normal. No respiratory distress.  Abdominal:     General: There is no distension.     Palpations: Abdomen is soft.     Tenderness: There is abdominal tenderness in the periumbilical area. There is no guarding or rebound.     Hernia: A hernia is present. Hernia is present in the ventral area.  Musculoskeletal:        General: No tenderness. Normal range of motion.     Cervical back: Normal range of motion and neck supple.  Skin:    General: Skin is warm and dry.     Findings: No erythema or rash.  Neurological:     Mental Status: He is alert and oriented to person, place, and time.  Psychiatric:        Mood and Affect: Mood normal.        Behavior: Behavior normal.        Thought Content: Thought content normal.     ED Results / Procedures / Treatments   Labs (all  labs ordered are listed, but only abnormal results are displayed) Labs Reviewed - No data to display  EKG None  Radiology CT ABDOMEN PELVIS WO CONTRAST  Result Date: 02/11/2021 CLINICAL DATA:  Abdominal pain EXAM: CT ABDOMEN AND PELVIS WITHOUT CONTRAST TECHNIQUE: Multidetector CT imaging of the abdomen and pelvis was performed following the standard protocol without IV contrast. COMPARISON:  05/17/2020 FINDINGS: Lower chest: No acute abnormality. Hepatobiliary: Previous cholecystectomy. No focal liver abnormality. No biliary ductal dilatation. Pancreas: Unremarkable. No pancreatic ductal dilatation or surrounding inflammatory changes. Spleen: Normal in size without focal abnormality. Adrenals/Urinary Tract: Normal adrenal glands. Right nephrectomy. Left kidney normal. Urinary bladder is unremarkable. Stomach/Bowel: Stomach is within normal limits. Appendix appears normal. No evidence of bowel wall thickening, distention, or inflammatory changes. Vascular/Lymphatic: No significant vascular findings are present. No enlarged abdominal or pelvic lymph nodes. Reproductive: Prostate is unremarkable. Other: There is a ventral, midline supraumbilical hernia which contains fat only. A hyperdense rim with surrounding soft tissue stranding around the herniated fat consistent with fat necrosis secondary to presumed incarceration. Fat stranding extends posteriorly to involve the intra-abdominal portion of the omentum, image 49/2. Several additional tiny ventral midline hernias are noted within the more proximal ventral abdominal wall which also just contain fat. Fat containing left umbilical hernia. Musculoskeletal: No acute or significant osseous findings. IMPRESSION: 1. There is a ventral, midline supraumbilical hernia which contains fat only. There is a hyperdense rim with surrounding soft tissue stranding around the herniated fat consistent with fat necrosis secondary to presumed incarceration. Fat stranding into  the intra-abdominal portion of the omentum. 2. Status post cholecystectomy and right nephrectomy. Electronically Signed   By: Kerby Moors M.D.   On: 02/11/2021 10:39    Procedures Procedures   Medications Ordered in ED Medications - No data to display  ED Course  I have reviewed the triage vital signs and the nursing notes.  Pertinent labs & imaging results that were available during my care of the patient were reviewed by me and considered in my medical decision making (see chart for details).    MDM Rules/Calculators/A&P                          Patient presenting with ongoing midline abdominal pain for the last 2 weeks.  It seems to be worse with movement but no significant associated  complaints.  Patient has had multiple abdominal surgeries and on exam a firm tender palpable mass is noted proximal to his cholecystectomy scar.  Concern for ventral hernia.  Low suspicion for obstruction at this time.  Patient had lab work done On 02/09/2021 which showed a normal CBC with white count of 9.7, hemoglobin of 16 and normal platelets, CMP with creatinine of 1.21 which is patient's baseline, normal sodium, potassium, LFTs and total bilirubin.  Patient is well-appearing today but does have pain with palpation of the abdomen.  We will do a CT to further identify what is going on in the abdomen.  Since the patient was recently given Toradol and has only 1 kidney we will do the scan without contrast.  10:46 AM CT shows a ventral midline supraumbilical hernia which contains fat only with evidence of fat necrosis secondary to presumed incarceration.  No bowel is present at this time but there is some mild fat stranding into the intra-abdominal portion of the omentum.  Spoke with general surgery and they will follow up with him in clinic.  Patient given prescription for pain medication to use as needed if it becomes severe.  MDM Number of Diagnoses or Management Options   Amount and/or Complexity of  Data Reviewed Tests in the radiology section of CPT: ordered and reviewed Independent visualization of images, tracings, or specimens: yes     Final Clinical Impression(s) / ED Diagnoses Final diagnoses:  Ventral hernia without obstruction or gangrene    Rx / DC Orders ED Discharge Orders         Ordered    HYDROcodone-acetaminophen (NORCO/VICODIN) 5-325 MG tablet  Every 6 hours PRN        02/11/21 1108           Blanchie Dessert, MD 02/11/21 1109

## 2021-02-13 NOTE — ED Provider Notes (Signed)
[facility] [facility]    CSN: [id number] Arrival date & time: 02/09/21  4259      History   Chief Complaint Chief Complaint  Patient presents with  . Abdominal Pain    HPI Brett [name] is a 49 y.o. male.   Presenting today with several week history of localized lower abdominal pain that worsened 2 days ago. He states the pain is at the site of a past cholecystectomy scar and he's felt a bulge in this area from time to time. Denies fever, chills, skin changes in the area, bowel changes, nausea, vomiting, urinary sxs. So far taking OTC pain medications with mild benefit temporarily.      Past Medical History:  Diagnosis Date  . Cancer [facility]) 2006   Renal Cell    Patient Active Problem List   Diagnosis Date Noted  . History of Renal Cell Carcinoma 03/06/2013    Past Surgical History:  Procedure Laterality Date  . CHOLECYSTECTOMY  05/2020  . NEPHRECTOMY  2006   RIGHT; Renal Cell Carcinoma       Home Medications    Prior to Admission medications   Medication Sig Start Date End Date Taking? Authorizing Provider  HYDROcodone-acetaminophen (NORCO/VICODIN) 5-325 MG tablet Take 2 tablets by mouth every 6 (six) hours as needed for severe pain. 02/11/21   Blanchie Dessert, MD  pantoprazole (PROTONIX) 40 MG tablet Take 1 tablet by mouth every morning. 01/08/21   [provider]    Family History Family History  Problem Relation Age of Onset  . Healthy Mother     Social History Social History   Tobacco Use  . Smoking status: Never Smoker  . Smokeless tobacco: Never Used  Vaping Use  . Vaping Use: Never used  Substance Use Topics  . Alcohol use: No  . Drug use: No     Allergies   Other   Review of Systems Review of Systems PER HPI    Physical Exam Triage Vital Signs ED Triage Vitals  Enc Vitals Group     BP 02/09/21 0931 (!) 146/100     Pulse Rate 02/09/21 0931 93     Resp 02/09/21 0931 18     Temp 02/09/21 0931 98.5 F (36.9 C)      Temp src --      SpO2 02/09/21 0931 95 %     Weight --      Height --      Head Circumference --      Peak Flow --      Pain Score 02/09/21 0933 8     Pain Loc --      Pain Edu? --      Excl. in [place]? --    No data found.  Updated Vital Signs BP (!) 146/100 (BP Location: Left Arm)   Pulse 93   Temp 98.5 F (36.9 C)   Resp 18   SpO2 95%   Visual Acuity Right Eye Distance:   Left Eye Distance:   Bilateral Distance:    Right Eye Near:   Left Eye Near:    Bilateral Near:     Physical Exam Vitals and nursing note reviewed.  Constitutional:      Appearance: Normal appearance.  HENT:     Head: Atraumatic.  Eyes:     Extraocular Movements: Extraocular movements intact.     Conjunctiva/sclera: Conjunctivae normal.  Cardiovascular:     Rate and Rhythm: Normal rate and regular rhythm.     Pulses:  Normal pulses.     Heart sounds: Normal heart sounds.  Pulmonary:     Effort: Pulmonary effort is normal.     Breath sounds: Normal breath sounds.  Abdominal:     General: Bowel sounds are normal. There is no distension.     Palpations: Abdomen is soft.     Tenderness: There is abdominal tenderness (moderate to significant localized ttp proximal to cholecystectomy scar ). There is no guarding.     Comments: No obvious hernia defect or bulge on palpation, no erythema or other discoloration, no warmth. Patient able to tolerate a partial sit up during exam  Musculoskeletal:        General: Normal range of motion.     Cervical back: Normal range of motion and neck supple.  Skin:    General: Skin is warm and dry.     Findings: No bruising or erythema.  Neurological:     General: No focal deficit present.     Mental Status: He is oriented to person, place, and time.  Psychiatric:        Mood and Affect: Mood normal.        Thought Content: Thought content normal.        Judgment: Judgment normal.      UC Treatments / Results  Labs (all labs ordered are listed, but only  abnormal results are displayed) Labs Reviewed  COMPREHENSIVE METABOLIC PANEL - Abnormal; Notable for the following components:      Result Value   CO2 18 (*)    All other components within normal limits   Narrative:    Performed at:  [address] [place], [place]  [id number] Lab Director: Rush Farmer MD, Phone:  [phone]  CBC WITH DIFFERENTIAL/PLATELET   Narrative:    Performed at:  [facility] [address], [place], [place]  [id number] Lab Director: Rush Farmer MD, Phone:  [phone]    EKG   Radiology No results found.  Procedures Procedures (including critical care time)  Medications Ordered in UC Medications  ketorolac (TORADOL) injection 60 mg (60 mg Intramuscular Given 02/09/21 1013)    Initial Impression / Assessment and Plan / UC Course  I have reviewed the triage vital signs and the nursing notes.  Pertinent labs & imaging results that were available during my care of the patient were reviewed by me and considered in my medical decision making (see chart for details).     Suspicious for ventral hernia, and with acutely worsening pain concern for strangulation. Discussed with patient that we are limited in ability to image the area in this setting and he would need to either go to ED or follow up in next few days with General Surgery for urgent imaging and further management. He declines ED today so will obtain basic labs to ensure no emergent findings. Information given for very close Gen Surgery follow up. IM toradol given for pain relief.   Final Clinical Impressions(s) / UC Diagnoses   Final diagnoses:  Lower abdominal pain     Discharge Instructions     Go to the ER if your symptoms worsen at any point in time.  Call [facility] surgery and see if they can see you in the next few days for further evaluation.    ED Prescriptions    None     PDMP not reviewed this encounter.   Volney American, [place] 02/13/21 1146

## 2021-02-28 ENCOUNTER — Ambulatory Visit: Payer: Self-pay | Admitting: General Surgery

## 2021-02-28 NOTE — H&P (Signed)
History of Present Illness Brett [name]; 02/28/2021 2:30 PM) The patient is a 50 year old male who presents with an incisional hernia. Chief Complaint: Incisional hernia  Patient is a 50 year old male, with a history of kidney cancer, cholecystectomy, Coumadin secondary to a bulge above his umbilicus. Patient states he did have cholecystectomy at [facility]. He states that this was an approximately to 3 months ago. He states that thereafter he noticed a bulge just above his midline umbilical incision. He states that with his job he does develop leaning and lifting against his abdominal wall. He states that upon evaluation EGD he was told he had an incisional hernia. Patient did undergo CT scan which I reviewed personally. CT scan does show a incarcerated piece of omentum within the hernia. This does appear to be just superior to the umbilicus.  Patient had no signs or symptoms of incarceration or granulation.  Patient does have a right upper quadrant Coker incision for a kidney extraction.     Past Surgical History Brett Forehand, [name]; 02/28/2021 2:23 PM) Gallbladder Surgery - Laparoscopic  Nephrectomy  Right. Shoulder Surgery  Bilateral. Vasectomy   Diagnostic Studies History Brett Forehand, [name]; 02/28/2021 2:23 PM) Colonoscopy  1-5 years ago  Allergies Brett Forehand, [name]; 02/28/2021 2:23 PM) No Known Drug Allergies  [02/28/2021]: Allergies Reconciled   Medication History Brett Forehand, [name]; 02/28/2021 2:23 PM) Pantoprazole Sodium (40MG  Tablet DR, Oral) Active. Medications Reconciled  Social History Brett Forehand, [name]; 02/28/2021 2:23 PM) No alcohol use  No drug use  Tobacco use  Never smoker.  Family History Brett Forehand, [name]; 02/28/2021 2:23 PM) Prostate Cancer  Father. Thyroid problems  Mother.  Other Problems Brett Forehand, [name]; 02/28/2021 2:23 PM) Cancer  Gastroesophageal Reflux Disease  Other disease, cancer,  significant illness     Review of Systems Brett [name]; 02/28/2021 2:28 PM) General Not Present- Appetite Loss, Chills, Fatigue, Fever, Night Sweats, Weight Gain and Weight Loss. HEENT Not Present- Earache, Hearing Loss, Hoarseness, Nose Bleed, Oral Ulcers, Ringing in the Ears, Seasonal Allergies, Sinus Pain, Sore Throat, Visual Disturbances, Wears glasses/contact lenses and Yellow Eyes. Respiratory Not Present- Bloody sputum, Chronic Cough, Difficulty Breathing, Snoring and Wheezing. Breast Not Present- Breast Mass, Breast Pain, Nipple Discharge and Skin Changes. Cardiovascular Not Present- Chest Pain, Difficulty Breathing Lying Down, Leg Cramps, Palpitations, Rapid Heart Rate, Shortness of Breath and Swelling of Extremities. Gastrointestinal Present- Abdominal Pain. Not Present- Bloating, Bloody Stool, Change in Bowel Habits, Chronic diarrhea, Constipation, Difficulty Swallowing, Excessive gas, Gets full quickly at meals, Hemorrhoids, Indigestion, Nausea, Rectal Pain and Vomiting. Male Genitourinary Not Present- Blood in Urine, Change in Urinary Stream, Frequency, Impotence, Nocturia, Painful Urination, Urgency and Urine Leakage. Musculoskeletal Not Present- Back Pain, Joint Pain, Joint Stiffness, Muscle Pain, Muscle Weakness and Swelling of Extremities. Neurological Not Present- Decreased Memory, Fainting, Headaches, Numbness, Seizures, Tingling, Tremor, Trouble walking and Weakness. Hematology Not Present- Blood Thinners, Easy Bruising, Excessive bleeding, Gland problems, HIV and Persistent Infections. All other systems negative  Vitals Adriana Reams Alston [name]; 02/28/2021 2:24 PM) 02/28/2021 2:23 PM Weight: 304.13 lb Height: 74in Body Surface Area: 2.6 m Body Mass Index: 39.05 kg/m  Temp.: 97.11F  Pulse: 99 (Regular)  P.OX: 98% (Room air) BP: 160/100(Sitting, Left Arm, Standard)       Physical Exam Brett [name]; 02/28/2021 2:31 PM) The physical exam findings  are as follows: Note: Constitutional: No acute distress, conversant, appears stated age  Eyes: Anicteric sclerae, moist conjunctiva, no lid lag  Neck: No  thyromegaly, trachea midline, no cervical lymphadenopathy  Lungs: Clear to auscultation biilaterally, normal respiratory effot  Cardiovascular: regular rate & rhythm, no murmurs, no peripheal edema, pedal pulses 2+  GI: Soft, no masses or hepatosplenomegaly, non-tender to palpation, right upper quadrant Coker incision, midline superior umbilical transverse incision.  MSK: Normal gait, no clubbing cyanosis, edema  Skin: No rashes, palpation reveals normal skin turgor  Psychiatric: Appropriate judgment and insight, oriented to person, place, and time  Abdomen Inspection Hernias - Incisional - Reducible.    Assessment & Plan Brett [name]; 02/28/2021 2:31 PM) INCISIONAL HERNIA, WITHOUT OBSTRUCTION OR GANGRENE (K43.2) Impression: Patient is a 50 year old male with a history of kidney cancer, comes in with an incisional hernia status post laparoscopic cholecystectomy. 1. The patient will like to proceed to the operating room for laparoscopic right inguinal hernia repair with mesh.  2. I discussed with the patient the signs and symptoms of incarceration and strangulation and the need to proceed to the ER should they occur.  3. I discussed with the patient the risks and benefits of the procedure to include but not limited to: Infection, bleeding, damage to surrounding structures, possible need for further surgery, possible nerve pain, and possible recurrence. The patient was understanding and wishes to proceed.

## 2021-04-11 NOTE — Progress Notes (Signed)
Surgical Instructions    Your procedure is scheduled on Wednesday, June 29th.  Report to [facility] Main Main Entrance "A" at 10:45 A.M., then check in with the Admitting office.  Call this number if you have problems the morning of surgery:  [phone]   If you have any questions prior to your surgery date call [phone]: Open Monday-Friday 8am-4pm    Remember:  Do not eat after midnight the night before your surgery  You may drink clear liquids until 9:45 a.m. the morning of your surgery.   Clear liquids allowed are: Water, Non-Citrus Juices (without pulp), Carbonated Beverages, Clear Tea, Black Coffee Only, and Gatorade    Take these medicines the morning of surgery with A SIP OF WATER   pantoprazole (PROTONIX) fexofenadine (ALLEGRA  Take these medications as needed:  HYDROcodone-acetaminophen (NORCO/VICODIN) acetaminophen (TYLENOL)   As of today, STOP taking any Aspirin (unless otherwise instructed by your surgeon) Aleve, Naproxen, Ibuprofen, Motrin, Advil, Goody's, BC's, all herbal medications, fish oil, and all vitamins.          Do not wear jewelry. Do not wear lotions, powders, colognes, or deodorant. Do not shave 48 hours prior to surgery.  Men may shave face and neck. Do not bring valuables to the hospital.             [facility] is not responsible for any belongings or valuables.  Do NOT Smoke (Tobacco/Vaping) or drink Alcohol 24 hours prior to your procedure If you use a CPAP at night, you may bring all equipment for your overnight stay.   Contacts, glasses, dentures or bridgework may not be worn into surgery, please bring cases for these belongings     Patients discharged the day of surgery will not be allowed to drive home, and someone needs to stay with them for 24 hours.  ONLY 1 SUPPORT PERSON MAY BE PRESENT WHILE YOU ARE IN SURGERY. IF YOU ARE TO BE ADMITTED ONCE YOU ARE IN YOUR ROOM YOU WILL BE ALLOWED TWO (2) VISITORS.   Special instructions:    Oral  Hygiene is also important to reduce your risk of infection.  Remember - BRUSH YOUR TEETH THE MORNING OF SURGERY WITH YOUR REGULAR TOOTHPASTE   [place]- Preparing For Surgery  Before surgery, you can play an important role. Because skin is not sterile, your skin needs to be as free of germs as possible. You can reduce the number of germs on your skin by washing with CHG (chlorahexidine gluconate) Soap before surgery.  CHG is an antiseptic cleaner which kills germs and bonds with the skin to continue killing germs even after washing.     Please do not use if you have an allergy to CHG or antibacterial soaps. If your skin becomes reddened/irritated stop using the CHG.  Do not shave (including legs and underarms) for at least 48 hours prior to first CHG shower. It is OK to shave your face.  Please follow these instructions carefully.     Shower the NIGHT BEFORE SURGERY and the MORNING OF SURGERY with CHG Soap.   If you chose to wash your hair, wash your hair first as usual with your normal shampoo. After you shampoo, rinse your hair and body thoroughly to remove the shampoo.  Then [employer] and genitals (private parts) with your normal soap and rinse thoroughly to remove soap.  After that Use CHG Soap as you would any other liquid soap. You can apply CHG directly to the skin and wash gently  with a scrungie or a clean washcloth.   Apply the CHG Soap to your body ONLY FROM THE NECK DOWN.  Do not use on open wounds or open sores. Avoid contact with your eyes, ears, mouth and genitals (private parts). Wash Face and genitals (private parts)  with your normal soap.   Wash thoroughly, paying special attention to the area where your surgery will be performed.  Thoroughly rinse your body with warm water from the neck down.  DO NOT shower/wash with your normal soap after using and rinsing off the CHG Soap.  Pat yourself dry with a CLEAN TOWEL.  Wear CLEAN PAJAMAS to bed the night before  surgery  Place CLEAN SHEETS on your bed the night before your surgery  DO NOT SLEEP WITH PETS.   Day of Surgery:  Take a shower with CHG soap. Wear Clean/Comfortable clothing the morning of surgery Do not apply any deodorants/lotions.   Remember to brush your teeth WITH YOUR REGULAR TOOTHPASTE.   Please read over the following fact sheets that you were given.

## 2021-04-11 NOTE — Progress Notes (Signed)
Surgical Instructions    Your procedure is scheduled on Wednesday, June 29th.  Report to [facility] Main Entrance "A" at 10:15 A.M., then check in with the Admitting office.  Call this number if you have problems the morning of surgery:  [phone]   If you have any questions prior to your surgery date call [phone]: Open Monday-Friday 8am-4pm    Remember:  Do not eat after midnight the night before your surgery  You may drink clear liquids until 9:15 a.m. the morning of your surgery.   Clear liquids allowed are: Water, Non-Citrus Juices (without pulp), Carbonated Beverages, Clear Tea, Black Coffee Only, and Gatorade    Take these medicines the morning of surgery with A SIP OF WATER   pantoprazole (PROTONIX) fexofenadine (ALLEGRA  Take these medications as needed:  HYDROcodone-acetaminophen (NORCO/VICODIN) acetaminophen (TYLENOL)   As of today, STOP taking any Aspirin (unless otherwise instructed by your surgeon) Aleve, Naproxen, Ibuprofen, Motrin, Advil, Goody's, BC's, all herbal medications, fish oil, and all vitamins.          Do not wear jewelry. Do not wear lotions, powders, colognes, or deodorant. Do not shave 48 hours prior to surgery.  Men may shave face and neck. Do not bring valuables to the hospital.             [facility] is not responsible for any belongings or valuables.  Do NOT Smoke (Tobacco/Vaping) or drink Alcohol 24 hours prior to your procedure If you use a CPAP at night, you may bring all equipment for your overnight stay.   Contacts, glasses, dentures or bridgework may not be worn into surgery, please bring cases for these belongings     Patients discharged the day of surgery will not be allowed to drive home, and someone needs to stay with them for 24 hours.  ONLY 1 SUPPORT PERSON MAY BE PRESENT WHILE YOU ARE IN SURGERY. IF YOU ARE TO BE ADMITTED ONCE YOU ARE IN YOUR ROOM YOU WILL BE ALLOWED TWO (2) VISITORS.   Special instructions:    Oral  Hygiene is also important to reduce your risk of infection.  Remember - BRUSH YOUR TEETH THE MORNING OF SURGERY WITH YOUR REGULAR TOOTHPASTE   [place]- Preparing For Surgery  Before surgery, you can play an important role. Because skin is not sterile, your skin needs to be as free of germs as possible. You can reduce the number of germs on your skin by washing with CHG (chlorahexidine gluconate) Soap before surgery.  CHG is an antiseptic cleaner which kills germs and bonds with the skin to continue killing germs even after washing.     Please do not use if you have an allergy to CHG or antibacterial soaps. If your skin becomes reddened/irritated stop using the CHG.  Do not shave (including legs and underarms) for at least 48 hours prior to first CHG shower. It is OK to shave your face.  Please follow these instructions carefully.     Shower the NIGHT BEFORE SURGERY and the MORNING OF SURGERY with CHG Soap.   If you chose to wash your hair, wash your hair first as usual with your normal shampoo. After you shampoo, rinse your hair and body thoroughly to remove the shampoo.  Then [employer] and genitals (private parts) with your normal soap and rinse thoroughly to remove soap.  After that Use CHG Soap as you would any other liquid soap. You can apply CHG directly to the skin and wash gently  with a scrungie or a clean washcloth.   Apply the CHG Soap to your body ONLY FROM THE NECK DOWN.  Do not use on open wounds or open sores. Avoid contact with your eyes, ears, mouth and genitals (private parts). Wash Face and genitals (private parts)  with your normal soap.   Wash thoroughly, paying special attention to the area where your surgery will be performed.  Thoroughly rinse your body with warm water from the neck down.  DO NOT shower/wash with your normal soap after using and rinsing off the CHG Soap.  Pat yourself dry with a CLEAN TOWEL.  Wear CLEAN PAJAMAS to bed the night before  surgery  Place CLEAN SHEETS on your bed the night before your surgery  DO NOT SLEEP WITH PETS.   Day of Surgery: Take a shower with CHG soap. Wear Clean/Comfortable clothing the morning of surgery Do not apply any deodorants/lotions.   Remember to brush your teeth WITH YOUR REGULAR TOOTHPASTE.   Please read over the following fact sheets that you were given.

## 2021-04-12 ENCOUNTER — Other Ambulatory Visit: Payer: Self-pay

## 2021-04-12 ENCOUNTER — Encounter (HOSPITAL_COMMUNITY)
Admission: RE | Admit: 2021-04-12 | Discharge: 2021-04-12 | Disposition: A | Discharge status: Home / Self Care | Payer: BLUE CROSS/BLUE SHIELD | Source: Ambulatory Visit | Attending: General Surgery | Admitting: General Surgery

## 2021-04-12 ENCOUNTER — Encounter (HOSPITAL_COMMUNITY): Payer: Self-pay

## 2021-04-12 DIAGNOSIS — Z01812 Encounter for preprocedural laboratory examination: Secondary | ICD-10-CM | POA: Insufficient documentation

## 2021-04-12 HISTORY — DX: Unspecified osteoarthritis, unspecified site: M19.90

## 2021-04-12 HISTORY — DX: Gastro-esophageal reflux disease without esophagitis: K21.9

## 2021-04-12 LAB — CBC
HCT: 47.6 % (ref 39.0–52.0)
Hemoglobin: 15.6 g/dL (ref 13.0–17.0)
MCH: 28.1 pg (ref 26.0–34.0)
MCHC: 32.8 g/dL (ref 30.0–36.0)
MCV: 85.8 fL (ref 80.0–100.0)
Platelets: 239 10*3/uL (ref 150–400)
RBC: 5.55 MIL/uL (ref 4.22–5.81)
RDW: 14 % (ref 11.5–15.5)
WBC: 5.1 10*3/uL (ref 4.0–10.5)
nRBC: 0 % (ref 0.0–0.2)

## 2021-04-12 NOTE — Progress Notes (Signed)
Mrsa swab obtained in PAT d/t history of MSSA infection before gallbladder surgery at [facility] last year.

## 2021-04-12 NOTE — Progress Notes (Signed)
PCP - no pcp Cardiologist - denies [facility]: Dr. Army Melia Gastroenterology: Otis Brace  PPM/ICD - denies   Chest x-ray - n/a EKG - 05/18/20 (care everywhere-records requested) Stress Test - denies ECHO - denies Cardiac Cath - denies  Sleep Study - denies   No diabetes  As of today, STOP taking any Aspirin (unless otherwise instructed by your surgeon) Aleve, Naproxen, Ibuprofen, Motrin, Advil, Goody's, BC's, all herbal medications, fish oil, and all vitamins.  ERAS Protcol -yes PRE-SURGERY Ensure or G2- not given  COVID TEST- ambulatory surgery, not needed   Anesthesia review: Jeneen Rinks called about elevated blood pressure. Pt advised about checking blood pressure at home at least the night before surgery and the morning of surgery. No need to send chart to APP per Jeneen Rinks. EKG tracing requested from [facility]  Patient denies shortness of breath, fever, cough and chest pain at PAT appointment   All instructions explained to the patient, with a verbal understanding of the material. Patient agrees to go over the instructions while at home for a better understanding. Patient also instructed to self quarantine after being tested for COVID-19. The opportunity to ask questions was provided.

## 2021-04-18 ENCOUNTER — Ambulatory Visit (HOSPITAL_COMMUNITY): Payer: BLUE CROSS/BLUE SHIELD | Admitting: Anesthesiology

## 2021-04-18 ENCOUNTER — Other Ambulatory Visit: Payer: Self-pay

## 2021-04-18 ENCOUNTER — Encounter (HOSPITAL_COMMUNITY)
Admission: RE | Disposition: A | Discharge status: Home / Self Care | Payer: Self-pay | Source: Home / Self Care | Attending: General Surgery

## 2021-04-18 ENCOUNTER — Ambulatory Visit (HOSPITAL_COMMUNITY)
Admission: RE | Admit: 2021-04-18 | Discharge: 2021-04-18 | Disposition: A | Discharge status: Home / Self Care | Payer: BLUE CROSS/BLUE SHIELD | Attending: General Surgery | Admitting: General Surgery

## 2021-04-18 ENCOUNTER — Encounter (HOSPITAL_COMMUNITY): Payer: Self-pay | Admitting: General Surgery

## 2021-04-18 ENCOUNTER — Ambulatory Visit (HOSPITAL_COMMUNITY): Payer: BLUE CROSS/BLUE SHIELD | Admitting: Physician Assistant

## 2021-04-18 DIAGNOSIS — K429 Umbilical hernia without obstruction or gangrene: Secondary | ICD-10-CM | POA: Insufficient documentation

## 2021-04-18 DIAGNOSIS — Z7901 Long term (current) use of anticoagulants: Secondary | ICD-10-CM | POA: Insufficient documentation

## 2021-04-18 DIAGNOSIS — Z79899 Other long term (current) drug therapy: Secondary | ICD-10-CM | POA: Diagnosis not present

## 2021-04-18 DIAGNOSIS — Z85528 Personal history of other malignant neoplasm of kidney: Secondary | ICD-10-CM | POA: Diagnosis not present

## 2021-04-18 DIAGNOSIS — K432 Incisional hernia without obstruction or gangrene: Secondary | ICD-10-CM | POA: Insufficient documentation

## 2021-04-18 HISTORY — PX: INCISIONAL HERNIA REPAIR: SHX193

## 2021-04-18 HISTORY — PX: INSERTION OF MESH: SHX5868

## 2021-04-18 SURGERY — REPAIR, HERNIA, INCISIONAL, LAPAROSCOPIC
Anesthesia: General | Site: Abdomen

## 2021-04-18 MED ORDER — FENTANYL CITRATE (PF) 100 MCG/2ML IJ SOLN
INTRAMUSCULAR | Status: AC
  Filled 2021-04-18: qty 2

## 2021-04-18 MED ORDER — ONDANSETRON HCL 4 MG/2ML IJ SOLN
INTRAMUSCULAR | Status: DC | PRN
  Administered 2021-04-18: 4 mg via INTRAVENOUS

## 2021-04-18 MED ORDER — PROPOFOL 10 MG/ML IV BOLUS
INTRAVENOUS | Status: AC
  Filled 2021-04-18: qty 20

## 2021-04-18 MED ORDER — CEFAZOLIN IN SODIUM CHLORIDE 3-0.9 GM/100ML-% IV SOLN
3.0000 g | INTRAVENOUS | Status: AC
  Administered 2021-04-18: 3 g via INTRAVENOUS
  Filled 2021-04-18: qty 100

## 2021-04-18 MED ORDER — TRAMADOL HCL 50 MG PO TABS: 50.0000 mg | ORAL_TABLET | Freq: Four times a day (QID) | ORAL | 0 refills | Status: AC | PRN

## 2021-04-18 MED ORDER — TRAMADOL HCL 50 MG PO TABS
50.0000 mg | ORAL_TABLET | Freq: Four times a day (QID) | ORAL | Status: DC | PRN
  Administered 2021-04-18: 50 mg via ORAL

## 2021-04-18 MED ORDER — CHLORHEXIDINE GLUCONATE 0.12 % MT SOLN
15.0000 mL | Freq: Once | OROMUCOSAL | Status: AC
  Administered 2021-04-18: 15 mL via OROMUCOSAL
  Filled 2021-04-18: qty 15

## 2021-04-18 MED ORDER — CHLORHEXIDINE GLUCONATE CLOTH 2 % EX PADS: 6.0000 | MEDICATED_PAD | Freq: Once | CUTANEOUS | Status: DC

## 2021-04-18 MED ORDER — 0.9 % SODIUM CHLORIDE (POUR BTL) OPTIME
TOPICAL | Status: DC | PRN
  Administered 2021-04-18: 1000 mL

## 2021-04-18 MED ORDER — LIDOCAINE 2% (20 MG/ML) 5 ML SYRINGE
INTRAMUSCULAR | Status: DC | PRN
  Administered 2021-04-18: 100 mg via INTRAVENOUS

## 2021-04-18 MED ORDER — BUPIVACAINE-EPINEPHRINE (PF) 0.25% -1:200000 IJ SOLN
INTRAMUSCULAR | Status: DC | PRN
  Administered 2021-04-18: 8 mL

## 2021-04-18 MED ORDER — BUPIVACAINE-EPINEPHRINE (PF) 0.25% -1:200000 IJ SOLN
INTRAMUSCULAR | Status: AC
  Filled 2021-04-18: qty 30

## 2021-04-18 MED ORDER — TRAMADOL HCL 50 MG PO TABS
ORAL_TABLET | ORAL | Status: AC
  Filled 2021-04-18: qty 1

## 2021-04-18 MED ORDER — FENTANYL CITRATE (PF) 100 MCG/2ML IJ SOLN
25.0000 ug | INTRAMUSCULAR | Status: DC | PRN
  Administered 2021-04-18 (×2): 50 ug via INTRAVENOUS

## 2021-04-18 MED ORDER — FENTANYL CITRATE (PF) 250 MCG/5ML IJ SOLN
INTRAMUSCULAR | Status: AC
  Filled 2021-04-18: qty 5

## 2021-04-18 MED ORDER — LACTATED RINGERS IV SOLN: INTRAVENOUS | Status: DC

## 2021-04-18 MED ORDER — ONDANSETRON HCL 4 MG/2ML IJ SOLN
INTRAMUSCULAR | Status: AC
  Filled 2021-04-18: qty 2

## 2021-04-18 MED ORDER — ORAL CARE MOUTH RINSE: 15.0000 mL | Freq: Once | OROMUCOSAL | Status: AC

## 2021-04-18 MED ORDER — ACETAMINOPHEN 500 MG PO TABS
1000.0000 mg | ORAL_TABLET | ORAL | Status: AC
  Administered 2021-04-18: 1000 mg via ORAL
  Filled 2021-04-18: qty 2

## 2021-04-18 MED ORDER — PROPOFOL 10 MG/ML IV BOLUS
INTRAVENOUS | Status: DC | PRN
  Administered 2021-04-18: 150 mg via INTRAVENOUS

## 2021-04-18 MED ORDER — DEXAMETHASONE SODIUM PHOSPHATE 10 MG/ML IJ SOLN
INTRAMUSCULAR | Status: DC | PRN
  Administered 2021-04-18: 10 mg via INTRAVENOUS

## 2021-04-18 MED ORDER — EPHEDRINE SULFATE-NACL 50-0.9 MG/10ML-% IV SOSY
PREFILLED_SYRINGE | INTRAVENOUS | Status: DC | PRN
  Administered 2021-04-18: 10 mg via INTRAVENOUS

## 2021-04-18 MED ORDER — FENTANYL CITRATE (PF) 250 MCG/5ML IJ SOLN
INTRAMUSCULAR | Status: DC | PRN
  Administered 2021-04-18 (×2): 50 ug via INTRAVENOUS
  Administered 2021-04-18: 100 ug via INTRAVENOUS
  Administered 2021-04-18: 50 ug via INTRAVENOUS

## 2021-04-18 MED ORDER — SUGAMMADEX SODIUM 200 MG/2ML IV SOLN
INTRAVENOUS | Status: DC | PRN
  Administered 2021-04-18: 300 mg via INTRAVENOUS

## 2021-04-18 MED ORDER — ROCURONIUM BROMIDE 10 MG/ML (PF) SYRINGE
PREFILLED_SYRINGE | INTRAVENOUS | Status: DC | PRN
  Administered 2021-04-18: 100 mg via INTRAVENOUS

## 2021-04-18 MED ORDER — MIDAZOLAM HCL 5 MG/5ML IJ SOLN
INTRAMUSCULAR | Status: DC | PRN
  Administered 2021-04-18: 2 mg via INTRAVENOUS

## 2021-04-18 MED ORDER — MIDAZOLAM HCL 2 MG/2ML IJ SOLN
INTRAMUSCULAR | Status: AC
  Filled 2021-04-18: qty 2

## 2021-04-18 MED ORDER — DEXAMETHASONE SODIUM PHOSPHATE 10 MG/ML IJ SOLN
INTRAMUSCULAR | Status: AC
  Filled 2021-04-18: qty 1

## 2021-04-18 SURGICAL SUPPLY — 47 items
ADH SKN CLS APL DERMABOND .7 (GAUZE/BANDAGES/DRESSINGS) ×2
APL PRP STRL LF DISP 70% ISPRP (MISCELLANEOUS) ×2
BAG COUNTER SPONGE SURGICOUNT (BAG) ×3 IMPLANT
BAG SPNG CNTER NS LX DISP (BAG) ×2
BAG SURGICOUNT SPONGE COUNTING (BAG) ×1
BLADE CLIPPER SURG (BLADE) IMPLANT
CANISTER SUCT 3000ML PPV (MISCELLANEOUS) IMPLANT
CHLORAPREP W/TINT 26 (MISCELLANEOUS) ×4 IMPLANT
COVER SURGICAL LIGHT HANDLE (MISCELLANEOUS) ×4 IMPLANT
DERMABOND ADVANCED (GAUZE/BANDAGES/DRESSINGS) ×2
DERMABOND ADVANCED .7 DNX12 (GAUZE/BANDAGES/DRESSINGS) ×2 IMPLANT
DEVICE SECURE STRAP 25 ABSORB (INSTRUMENTS) ×6 IMPLANT
ELECT REM PT RETURN 9FT ADLT (ELECTROSURGICAL) ×4
ELECTRODE REM PT RTRN 9FT ADLT (ELECTROSURGICAL) ×2 IMPLANT
GAUZE 4X4 16PLY ~~LOC~~+RFID DBL (SPONGE) ×2 IMPLANT
GLOVE SURG ENC MOIS LTX SZ7.5 (GLOVE) ×4 IMPLANT
GOWN STRL REUS W/ TWL LRG LVL3 (GOWN DISPOSABLE) ×4 IMPLANT
GOWN STRL REUS W/ TWL XL LVL3 (GOWN DISPOSABLE) ×2 IMPLANT
GOWN STRL REUS W/TWL LRG LVL3 (GOWN DISPOSABLE) ×8
GOWN STRL REUS W/TWL XL LVL3 (GOWN DISPOSABLE) ×4
GRASPER SUT TROCAR 14GX15 (MISCELLANEOUS) ×4 IMPLANT
KIT BASIN OR (CUSTOM PROCEDURE TRAY) ×4 IMPLANT
KIT TURNOVER KIT B (KITS) ×4 IMPLANT
MARKER SKIN DUAL TIP RULER LAB (MISCELLANEOUS) ×4 IMPLANT
MESH VENTRALIGHT ST 6X8 (Mesh Specialty) ×4 IMPLANT
MESH VENTRLGHT ELLIPSE 8X6XMFL (Mesh Specialty) IMPLANT
NDL INSUFFLATION 14GA 120MM (NEEDLE) ×2 IMPLANT
NDL SPNL 22GX3.5 QUINCKE BK (NEEDLE) IMPLANT
NEEDLE INSUFFLATION 14GA 120MM (NEEDLE) ×4 IMPLANT
NEEDLE SPNL 22GX3.5 QUINCKE BK (NEEDLE) IMPLANT
NS IRRIG 1000ML POUR BTL (IV SOLUTION) ×4 IMPLANT
PAD ARMBOARD 7.5X6 YLW CONV (MISCELLANEOUS) ×8 IMPLANT
SCISSORS LAP 5X35 DISP (ENDOMECHANICALS) ×4 IMPLANT
SET TUBE SMOKE EVAC HIGH FLOW (TUBING) ×4 IMPLANT
SLEEVE ENDOPATH XCEL 5M (ENDOMECHANICALS) ×4 IMPLANT
SPONGE T-LAP 18X18 ~~LOC~~+RFID (SPONGE) ×2 IMPLANT
SUT CHROMIC 2 0 SH (SUTURE) ×4 IMPLANT
SUT ETHIBOND 0 MO6 C/R (SUTURE) ×2 IMPLANT
SUT MNCRL AB 3-0 PS2 18 (SUTURE) ×4 IMPLANT
TOWEL GREEN STERILE (TOWEL DISPOSABLE) ×4 IMPLANT
TOWEL GREEN STERILE FF (TOWEL DISPOSABLE) ×4 IMPLANT
TRAY FOLEY W/BAG SLVR 16FR (SET/KITS/TRAYS/PACK)
TRAY FOLEY W/BAG SLVR 16FR ST (SET/KITS/TRAYS/PACK) IMPLANT
TRAY LAPAROSCOPIC MC (CUSTOM PROCEDURE TRAY) ×4 IMPLANT
TROCAR XCEL NON-BLD 5MMX100MML (ENDOMECHANICALS) ×4 IMPLANT
WARMER LAPAROSCOPE (MISCELLANEOUS) ×4 IMPLANT
WATER STERILE IRR 1000ML POUR (IV SOLUTION) ×4 IMPLANT

## 2021-04-18 NOTE — Anesthesia Procedure Notes (Signed)
Procedure Name: Intubation Date/Time: 04/18/2021 1:58 PM Performed by: Colin Benton, CRNA Pre-anesthesia Checklist: Patient identified, Emergency Drugs available, Suction available and Patient being monitored Patient Re-evaluated:Patient Re-evaluated prior to induction Oxygen Delivery Method: Circle System Utilized Preoxygenation: Pre-oxygenation with 100% oxygen Induction Type: IV induction Ventilation: Mask ventilation without difficulty Laryngoscope Size: Mac and 4 Grade View: Grade I Tube type: Oral Tube size: 7.5 mm Number of attempts: 1 Airway Equipment and Method: Stylet and Oral airway Placement Confirmation: ETT inserted through vocal cords under direct vision, positive ETCO2 and breath sounds checked- equal and bilateral Secured at: 23 cm Tube secured with: Tape Dental Injury: Teeth and Oropharynx as per pre-operative assessment

## 2021-04-18 NOTE — Discharge Instructions (Signed)
[facility] _______[facility] ? ?INGUINAL HERNIA REPAIR: POST OP INSTRUCTIONS ? ?Always review your discharge instruction sheet given to you by the facility where your surgery was performed. ?IF YOU HAVE DISABILITY OR FAMILY LEAVE FORMS, YOU MUST BRING THEM TO THE OFFICE FOR PROCESSING.   ?DO NOT GIVE THEM TO YOUR DOCTOR. ? ?1. A  prescription for pain medication may be given to you upon discharge.  Take your pain medication as prescribed, if needed.  If narcotic pain medicine is not needed, then you may take acetaminophen (Tylenol) or ibuprofen (Advil) as needed. ?2. Take your usually prescribed medications unless otherwise directed. ?If you need a refill on your pain medication, please contact your pharmacy.  They will contact our office to request authorization. Prescriptions will not be filled after 5 pm or on week-ends. ?3. You should follow a light diet the first 24 hours after arrival home, such as soup and crackers, etc.  Be sure to include lots of fluids daily.  Resume your normal diet the day after surgery. ?4.Most patients will experience some swelling and bruising around the umbilicus or in the groin and scrotum.  Ice packs and reclining will help.  Swelling and bruising can take several days to resolve.  ?6. It is common to experience some constipation if taking pain medication after surgery.  Increasing fluid intake and taking a stool softener (such as Colace) will usually help or prevent this problem from occurring.  A mild laxative (Milk of Magnesia or Miralax) should be taken according to package directions if there are no bowel movements after 48 hours. ?7. Unless discharge instructions indicate otherwise, you may remove your bandages 24-48 hours after surgery, and you may shower at that time.  You may have steri-strips (small skin tapes) in place directly over the incision.  These strips should be left on the skin for 7-10 days.  If your surgeon used skin glue on the incision, you may  shower in 24 hours.  The glue will flake off over the next 2-3 weeks.  Any sutures or staples will be removed at the office during your follow-up visit. ?8. ACTIVITIES:  You may resume regular (light) daily activities beginning the next day--such as daily self-care, walking, climbing stairs--gradually increasing activities as tolerated.  You may have sexual intercourse when it is comfortable.  Refrain from any heavy lifting or straining until approved by your doctor. ? ?a.You may drive when you are no longer taking prescription pain medication, you can comfortably wear a seatbelt, and you can safely maneuver your car and apply brakes. ?b.RETURN TO WORK:   ?_____________________________________________ ? ?9.You should see your doctor in the office for a follow-up appointment approximately 2-3 weeks after your surgery.  Make sure that you call for this appointment within a day or two after you arrive home to insure a convenient appointment time. ?10.OTHER INSTRUCTIONS: _________________________ ?   _____________________________________ ? ?WHEN TO CALL YOUR DOCTOR: ?Fever over 101.0 ?Inability to urinate ?Nausea and/or vomiting ?Extreme swelling or bruising ?Continued bleeding from incision. ?Increased pain, redness, or drainage from the incision ? ?The clinic staff is available to answer your questions during regular business hours.  Please don?t hesitate to call and ask to speak to one of the nurses for clinical concerns.  If you have a medical emergency, go to the nearest emergency room or call 911.  A surgeon from [facility] is always on call at the hospital ? ? ?[address], [address], [place], [place]    [address] ? ? [address] [address], [place], [place]   [address] ?[phone] ? [phone] ? FAX [phone] ?Web site: [email or web address] ? ?

## 2021-04-18 NOTE — Transfer of Care (Signed)
Immediate Anesthesia Transfer of Care Note  Patient: Brett [name]  Procedure(s) Performed: LAPAROSCOPIC INCISIONAL HERNIA REPAIR (Abdomen) INSERTION OF MESH (Abdomen)  Patient Location: PACU  Anesthesia Type:General  Level of Consciousness: awake, alert , oriented and patient cooperative  Airway & Oxygen Therapy: Patient Spontanous Breathing and Patient connected to face mask oxygen  Post-op Assessment: Report given to RN, Post -op Vital signs reviewed and stable and Patient moving all extremities X 4  Post vital signs: Reviewed and stable  Last Vitals:  Vitals Value Taken Time  BP 142/98 04/18/21 1500  Temp    Pulse 73 04/18/21 1502  Resp 14 04/18/21 1502  SpO2 97 % 04/18/21 1502  Vitals shown include unvalidated device data.  Last Pain:  Vitals:   04/18/21 1057  TempSrc:   PainSc: 1       Patients Stated Pain Goal: 4 (03/40/35 2481)  Complications: No notable events documented.

## 2021-04-18 NOTE — H&P (Signed)
History of Present Illness The patient is a 50 year old male who presents with an incisional hernia. Chief Complaint: Incisional hernia   Patient is a 50 year old male, with a history of kidney cancer, cholecystectomy, Coumadin secondary to a bulge above his umbilicus. Patient states he did have cholecystectomy at [facility].  He states that this was an approximately to 3 months ago.  He states that thereafter he noticed a bulge just above his midline umbilical incision.  He states that with his job he does develop leaning and lifting against his abdominal wall.  He states that upon evaluation EGD he was told he had an incisional hernia.  Patient did undergo CT scan which I reviewed personally.  CT scan does show a incarcerated piece of omentum within the hernia.  This does appear to be just superior to the umbilicus.   Patient had no signs or symptoms of incarceration or granulation.   Patient does have a right upper quadrant Coker incision for a kidney extraction.         Past Surgical History  Gallbladder Surgery - Laparoscopic   Nephrectomy   Right. Shoulder Surgery   Bilateral. Vasectomy     Diagnostic Studies History  Colonoscopy   1-5 years ago   Allergies  No Known Drug Allergies   [02/28/2021]: Allergies Reconciled     Medication History  Pantoprazole Sodium  (40MG  Tablet DR, Oral) Active. Medications Reconciled    Social History  No alcohol use   No drug use   Tobacco use   Never smoker.   Family History (Prostate Cancer   Father. Thyroid problems   Mother.   Other Problems Cancer   Gastroesophageal Reflux Disease   Other disease, cancer, significant illness         Review of Systems General Not Present- Appetite Loss, Chills, Fatigue, Fever, Night Sweats, Weight Gain and Weight Loss. HEENT Not Present- Earache, Hearing Loss, Hoarseness, Nose Bleed, Oral Ulcers, Ringing in the Ears, Seasonal Allergies, Sinus Pain, Sore Throat, Visual Disturbances,  Wears glasses/contact lenses and Yellow Eyes. Respiratory Not Present- Bloody sputum, Chronic Cough, Difficulty Breathing, Snoring and Wheezing. Breast Not Present- Breast Mass, Breast Pain, Nipple Discharge and Skin Changes. Cardiovascular Not Present- Chest Pain, Difficulty Breathing Lying Down, Leg Cramps, Palpitations, Rapid Heart Rate, Shortness of Breath and Swelling of Extremities. Gastrointestinal Present- Abdominal Pain. Not Present- Bloating, Bloody Stool, Change in Bowel Habits, Chronic diarrhea, Constipation, Difficulty Swallowing, Excessive gas, Gets full quickly at meals, Hemorrhoids, Indigestion, Nausea, Rectal Pain and Vomiting. Male Genitourinary Not Present- Blood in Urine, Change in Urinary Stream, Frequency, Impotence, Nocturia, Painful Urination, Urgency and Urine Leakage. Musculoskeletal Not Present- Back Pain, Joint Pain, Joint Stiffness, Muscle Pain, Muscle Weakness and Swelling of Extremities. Neurological Not Present- Decreased Memory, Fainting, Headaches, Numbness, Seizures, Tingling, Tremor, Trouble walking and Weakness. Hematology Not Present- Blood Thinners, Easy Bruising, Excessive bleeding, Gland problems, HIV and Persistent Infections. All other systems negative   BP (!) 154/99   Pulse 77   Temp 98.9 F (37.2 C) (Oral)   Resp 18   Ht 6\' 2"  (1.88 m)   Wt 133.8 kg   SpO2 98%   BMI 37.88 kg/m     Physical Exam  The physical exam findings are as follows: Note:   Constitutional: No acute distress, conversant, appears stated age   Eyes: Anicteric sclerae, moist conjunctiva, no lid lag   Neck: No thyromegaly, trachea midline, no cervical lymphadenopathy   Lungs: Clear to auscultation biilaterally, normal  respiratory effot   Cardiovascular: regular rate & rhythm, no murmurs, no peripheal edema, pedal pulses 2+   GI: Soft, no masses or hepatosplenomegaly, non-tender to palpation, right upper quadrant Coker incision, midline superior umbilical transverse  incision.   MSK: Normal gait, no clubbing cyanosis, edema   Skin: No rashes, palpation reveals normal skin turgor   Psychiatric: Appropriate judgment and insight, oriented to person, place, and time   Abdomen Inspection Hernias - Incisional - Reducible.       Assessment & Plan (INCISIONAL HERNIA, WITHOUT OBSTRUCTION OR GANGRENE (K43.2) Impression: Patient is a 50 year old male with a history of kidney cancer, comes in with an incisional hernia status post laparoscopic cholecystectomy. 1. The patient will like to proceed to the operating room for laparoscopic incisional hernia repair with mesh.   2. I discussed with the patient the signs and symptoms of incarceration and strangulation and the need to proceed to the ER should they occur.   3. I discussed with the patient the risks and benefits of the procedure to include but not limited to: Infection, bleeding, damage to surrounding structures, possible need for further surgery, possible nerve pain, and possible recurrence. The patient was understanding and wishes to proceed.

## 2021-04-18 NOTE — Anesthesia Preprocedure Evaluation (Addendum)
Anesthesia Evaluation  Patient identified by MRN, date of birth, ID band Patient awake    Reviewed: Allergy & Precautions, NPO status , Patient's Chart, lab work & pertinent test results  Airway Mallampati: I  TM Distance: >3 FB Neck ROM: Full    Dental no notable dental hx. (+) Teeth Intact, Dental Advisory Given   Pulmonary neg pulmonary ROS,    Pulmonary exam normal breath sounds clear to auscultation       Cardiovascular negative cardio ROS Normal cardiovascular exam Rhythm:Regular Rate:Normal     Neuro/Psych negative neurological ROS  negative psych ROS   GI/Hepatic Neg liver ROS, GERD  Controlled and Medicated,  Endo/Other  negative endocrine ROS  Renal/GU Renal disease (right RCC s/p nephrectomy)  negative genitourinary   Musculoskeletal  (+) Arthritis ,   Abdominal   Peds  Hematology negative hematology ROS (+)   Anesthesia Other Findings   Reproductive/Obstetrics                            Anesthesia Physical Anesthesia Plan  ASA: 2  Anesthesia Plan: General   Post-op Pain Management:    Induction: Intravenous  PONV Risk Score and Plan: 2 and Midazolam, Dexamethasone and Ondansetron  Airway Management Planned: Oral ETT  Additional Equipment:   Intra-op Plan:   Post-operative Plan: Extubation in OR  Informed Consent: I have reviewed the patients History and Physical, chart, labs and discussed the procedure including the risks, benefits and alternatives for the proposed anesthesia with the patient or authorized representative who has indicated his/her understanding and acceptance.     Dental advisory given  Plan Discussed with: CRNA  Anesthesia Plan Comments:         Anesthesia Quick Evaluation

## 2021-04-18 NOTE — Op Note (Signed)
04/18/2021  2:49 PM  PATIENT:  Brett [name]  50 y.o. male  PRE-OPERATIVE DIAGNOSIS:  INCISIONAL HERNIA  POST-OPERATIVE DIAGNOSIS:  INCISIONAL HERNIA  PROCEDURE:  Procedure(s): LAPAROSCOPIC INCISIONAL HERNIA REPAIR (N/A) INSERTION OF MESH  SURGEON:  Surgeon(s) and Role:    * Ralene Ok, MD - Primary  ASSISTANTS: Lucas Mallow, PGY-3   ANESTHESIA:   local and general  EBL:  5 mL   BLOOD ADMINISTERED:none  DRAINS: none   LOCAL MEDICATIONS USED:  BUPIVICAINE   SPECIMEN:  No Specimen  DISPOSITION OF SPECIMEN:  N/A  COUNTS:  YES  TOURNIQUET:  * No tourniquets in log *  DICTATION: .Dragon Dictation  Details of the procedure:   After the patient was consented patient was taken back to the operating room patient was then placed in supine position bilateral SCDs in place.  The patient was prepped and draped in the usual sterile fashion. After antibiotics were confirmed a timeout was called and all facts were verified. The Veress needle technique was used to insuflate the abdomen at Palmer's point. The abdomen was insufflated to 14 mm mercury. Subsequently a 5 mm trocar was placed a camera inserted there was no injury to any intra-abdominal organs.    There was seen to be an non-incarcerated  incisional supraumbilical hernia.  A second camera port was in placed into the left lower quadrant.   At this the Falicform ligament was taken down with Bovie cautery maintaining hemostasis.    I proceeded to reduce the hernia contents.  The hernia sac was dissected out of the hernia and disposed.  The fascia at the hernia was reapproximated using a #0 ethibonds x 3.  Pt also had an umbilical hernia.  The preperitoneal fascia was dissected and disposed.  Ethibond x 1 was used to close the hernia.  Once the hernia was cleared away, a Bard Ventralight 15x20cm  mesh was inserted into the abdomen.  The mesh was secured circumferentially with am Securestrap tacker in a double crown fashion.      The omentum was brought over the area of the mesh. The left lower quadrant trochar was closed using 0 vicryl and an endoclose. The pneumoperitoneum was evacuated  & all trocars  were removed. The skin was reapproximated with 4-0  Monocryl sutures in a subcuticular fashion. The skin was dressed with Dermabond.  The patient was taken to the recovery room in stable condition.  I was personally present during the key and critical portions of this procedure and immediately available throughout the entire procedure, as documented in my operative note.   Type of repair -primary suture & mesh  Mesh overlap - 5-6cm  Placement of mesh -  beneath fascia and into peritoneal cavity   PLAN OF CARE: Discharge to home after PACU  PATIENT DISPOSITION:  PACU - hemodynamically stable.   Delay start of Pharmacological VTE agent (>24hrs) due to surgical blood loss or risk of bleeding: not applicable

## 2021-04-19 ENCOUNTER — Encounter (HOSPITAL_COMMUNITY): Payer: Self-pay | Admitting: General Surgery

## 2021-04-19 NOTE — Anesthesia Postprocedure Evaluation (Signed)
Anesthesia Post Note  Patient: Brett [name]  Procedure(s) Performed: LAPAROSCOPIC INCISIONAL HERNIA REPAIR (Abdomen) INSERTION OF MESH (Abdomen)     Patient location during evaluation: PACU Anesthesia Type: General Level of consciousness: awake and alert Pain management: pain level controlled Vital Signs Assessment: post-procedure vital signs reviewed and stable Respiratory status: spontaneous breathing, nonlabored ventilation, respiratory function stable and patient connected to nasal cannula oxygen Cardiovascular status: blood pressure returned to baseline and stable Postop Assessment: no apparent nausea or vomiting Anesthetic complications: no   No notable events documented.  Last Vitals:  Vitals:   04/18/21 1545 04/18/21 1600  BP: (!) 151/104 (!) 141/92  Pulse: 67 81  Resp: (!) 9 19  Temp:  36.5 C  SpO2: 98% 98%    Last Pain:  Vitals:   04/18/21 1530  TempSrc:   PainSc: 9                  Abdulaziz Toman L Reeder Brisby

## 2021-11-23 IMAGING — CT CT ABDOMEN W/ CM
2 of 5 series · 15 of 46 positions shown, 17 images · IV contrast (Omnipaque)
Comparison: Ultrasound of earlier in the day. Abdominal CT
04/21/2017.

CLINICAL DATA: Upper abdominal pain. Nausea and vomiting since
[REDACTED] night.

EXAM:
CT ABDOMEN WITH CONTRAST
TECHNIQUE: Multidetector CT imaging of the abdomen was performed using the
standard protocol following bolus administration of intravenous
contrast.
CONTRAST:  100mL OMNIPAQUE IOHEXOL 300 MG/ML  SOLN

[Series 2: axial st · axial · 0.98mm/px · z∈[-329,-69]mm · 12 of 62 slices shown, 14 images]
[im 5/62  soft-tissue]
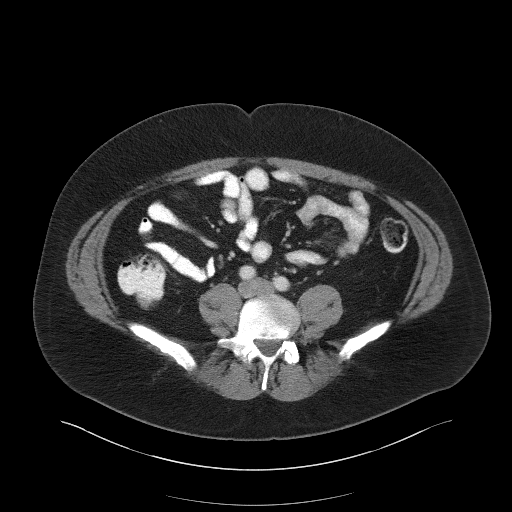
[im 5/62  bone]
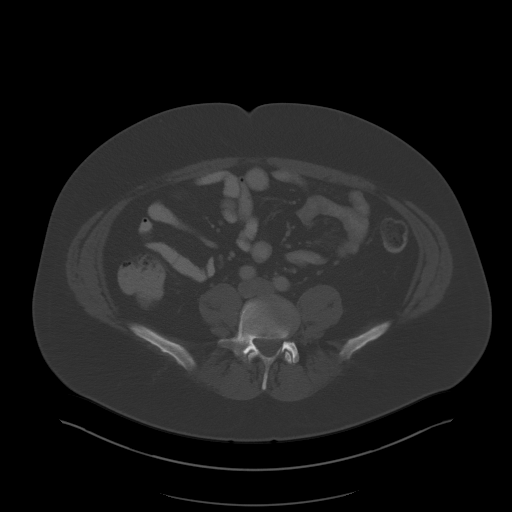
[im 9/62  soft-tissue]
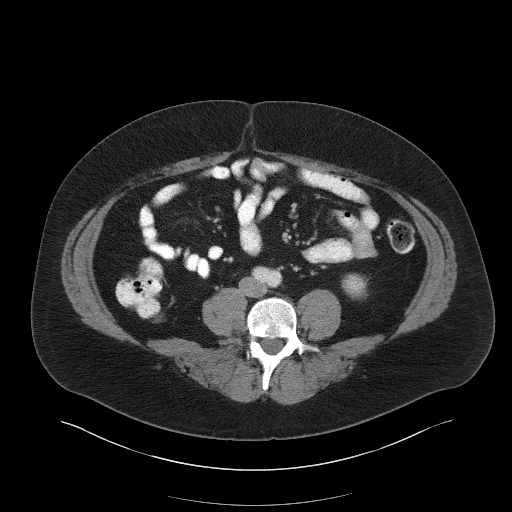
[im 13/62  soft-tissue]
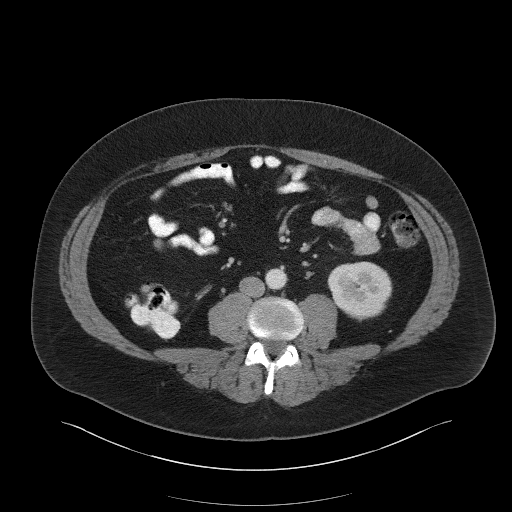
[im 21/62  soft-tissue]
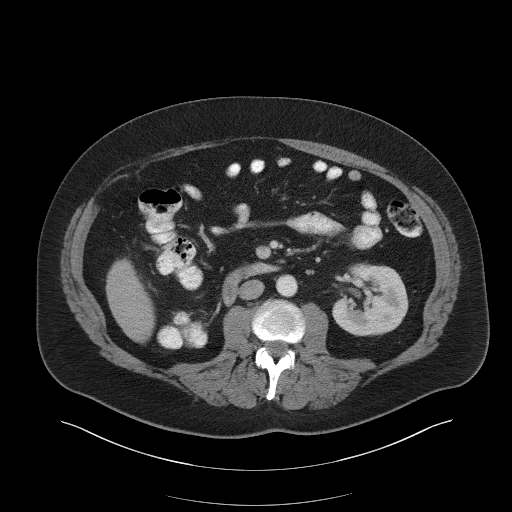
[im 25/62  soft-tissue]
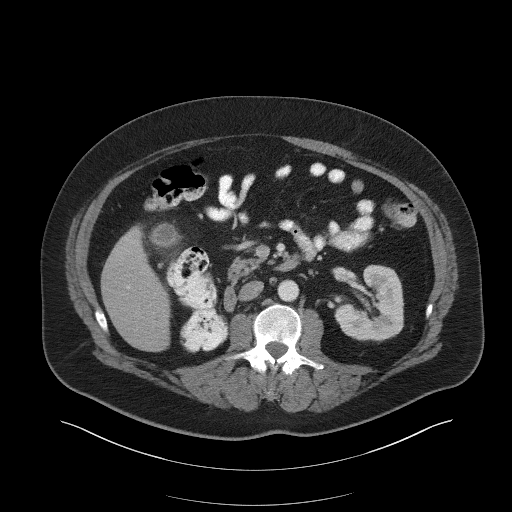
[im 29/62  soft-tissue]
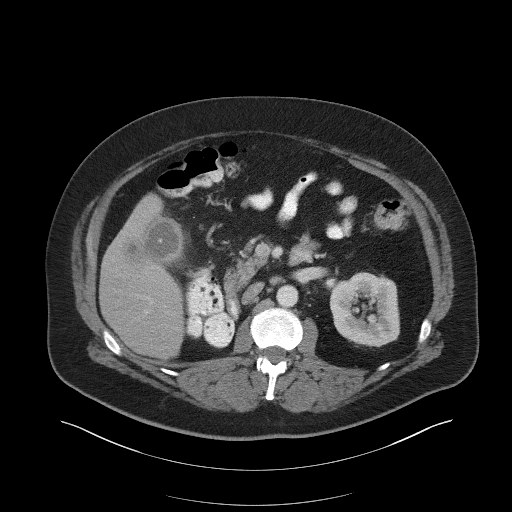
[im 33/62  soft-tissue]
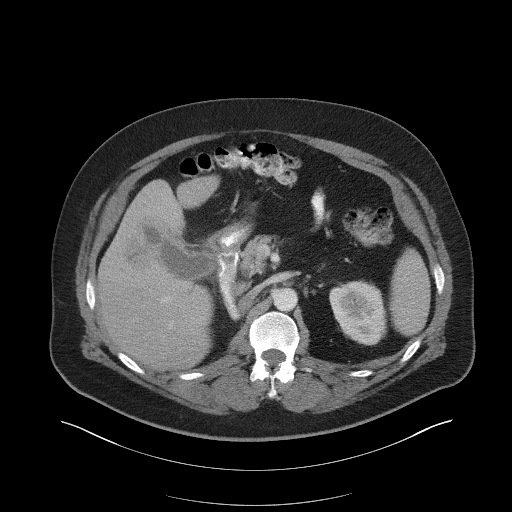
[im 37/62  soft-tissue]
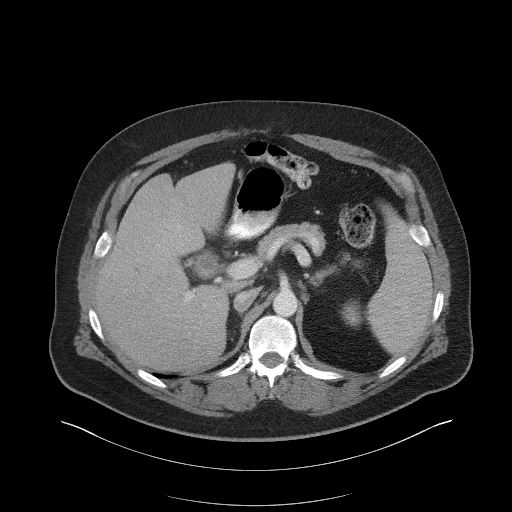
[im 41/62  soft-tissue]
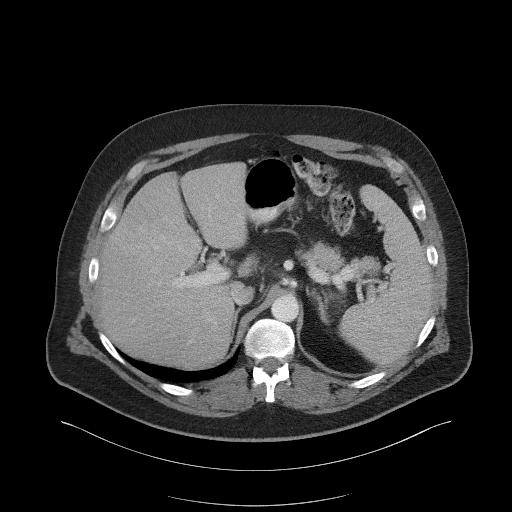
[im 41/62  bone]
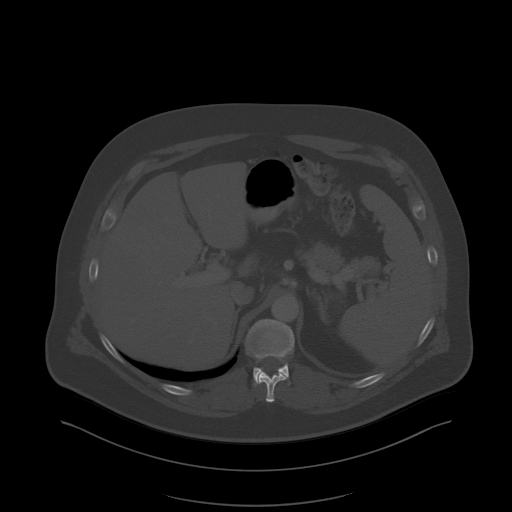
[im 49/62  soft-tissue]
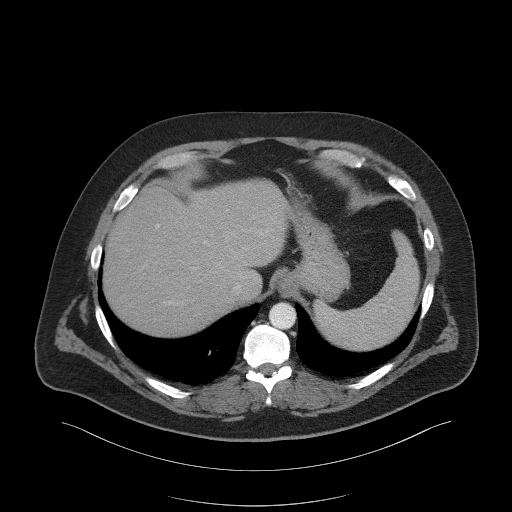
[im 53/62  soft-tissue]
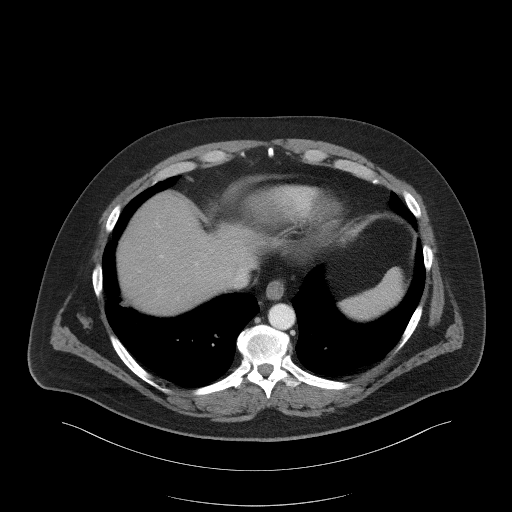
[im 57/62  soft-tissue]
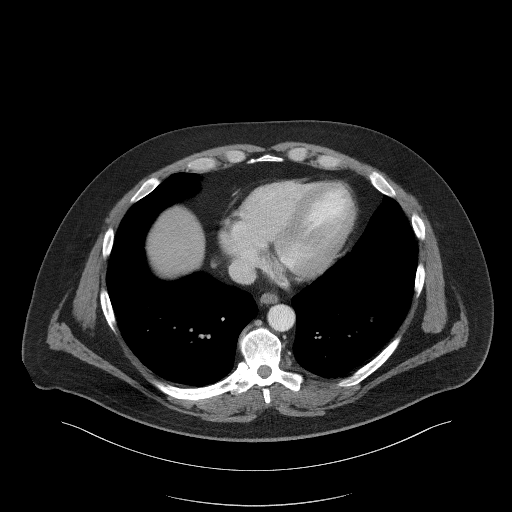

[Series 5: coronal st · coronal · 0.66mm/px · 3 of 114 slices shown]
[im 38/114  soft-tissue]
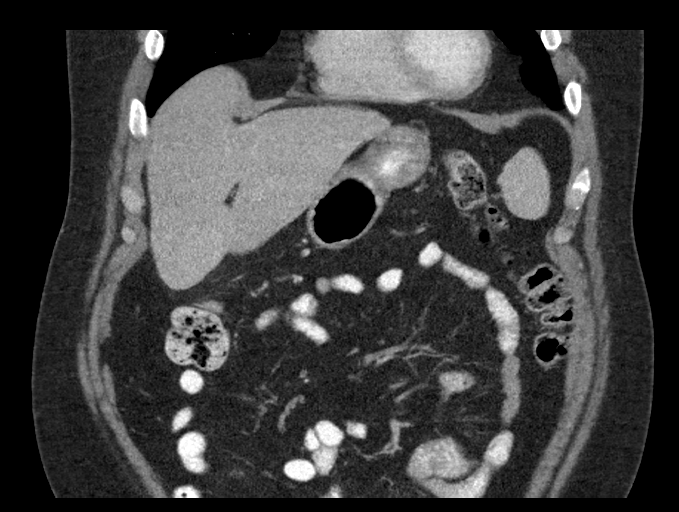
[im 51/114  soft-tissue]
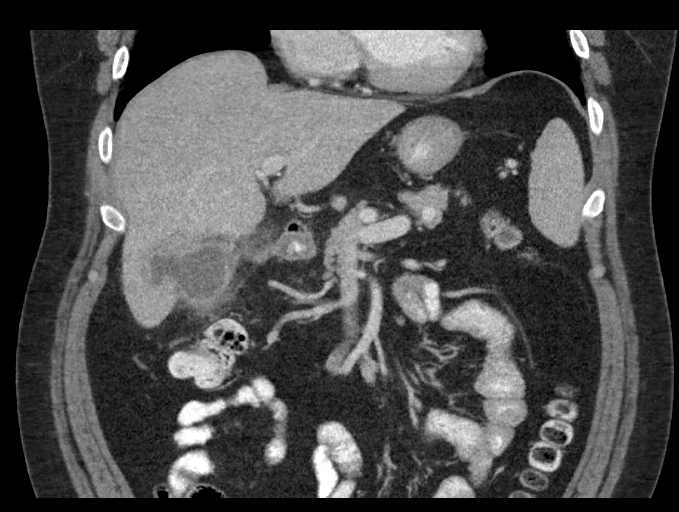
[im 63/114  soft-tissue]
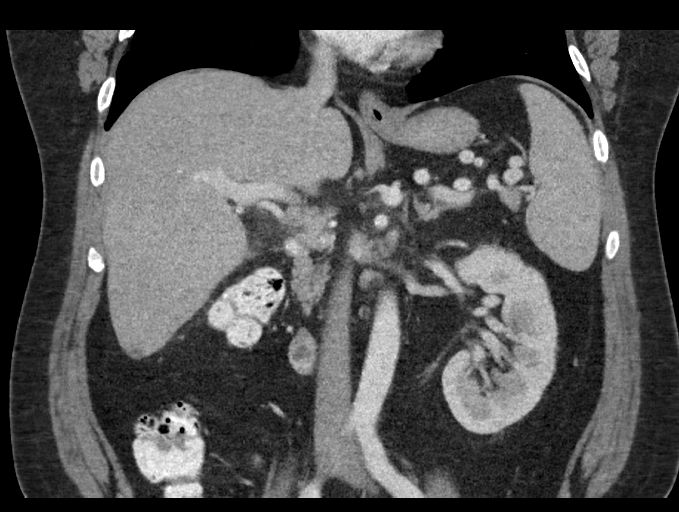

[15 of 46 positions shown; findings below may reference images not displayed]

FINDINGS: Lower chest: Clear lung bases. Normal heart size without pericardial
or pleural effusion.

Hepatobiliary: Subcentimeter right hepatic lobe cyst.

Tiny hyperattenuating gallstone. Gallbladder wall thickening and
moderate pericholecystic edema, including on 35/2. Contiguous
pericholecystic hypoattenuating foci including at 2.0 cm on 60 [DATE]
and more inferiorly and laterally at 3.3 cm on 32/2.

No biliary duct dilatation.

Pancreas: Normal, without mass or ductal dilatation.

Spleen: Normal in size, without focal abnormality.

Adrenals/Urinary Tract: Normal adrenal glands. Normal left kidney,
without hydronephrosis. Right nephrectomy.

Stomach/Bowel: Normal stomach, without wall thickening. Normal
abdominal bowel loops.

Vascular/Lymphatic: Normal caliber of the aorta and branch vessels.
Portal caval node measures 1.3 cm on [DATE] versus 8 mm on the prior.

Gastrohepatic ligament node measures 1.4 cm on [DATE] versus 3-4 mm on
the prior.

Other: No ascites. No evidence of omental or peritoneal disease.
Tiny fat containing ventral abdominal wall left paramidline hernia
on [DATE].

Musculoskeletal: Degenerate disc disease at L4-5.
IMPRESSION: 1. Gallstones and pericholecystic edema, most consistent with acute
cholecystitis. Direct extension of hypoattenuation within the
pericholecystic liver. Although this could represent direct spread
of infection, gallbladder carcinoma could have this appearance.
2. Development of borderline to mild periportal adenopathy since
04/21/2017. Either reactive or metastatic depending on the etiology
of the gallbladder abnormality.
3. Right nephrectomy.

These results will be called to the ordering clinician or
representative by the Radiologist Assistant, and communication
documented in the PACS or [REDACTED].

## 2021-12-18 ENCOUNTER — Other Ambulatory Visit: Payer: Self-pay

## 2021-12-18 ENCOUNTER — Encounter: Payer: Self-pay | Admitting: Emergency Medicine

## 2021-12-18 ENCOUNTER — Ambulatory Visit
Admission: EM | Admit: 2021-12-18 | Discharge: 2021-12-18 | Disposition: A | Discharge status: Home / Self Care | Payer: BC Managed Care – PPO | Attending: Physician Assistant | Admitting: Physician Assistant

## 2021-12-18 DIAGNOSIS — J069 Acute upper respiratory infection, unspecified: Secondary | ICD-10-CM

## 2021-12-18 MED ORDER — PROMETHAZINE-DM 6.25-15 MG/5ML PO SYRP: 5.0000 mL | ORAL_SOLUTION | Freq: Four times a day (QID) | ORAL | 0 refills | Status: AC | PRN

## 2021-12-18 NOTE — ED Provider Notes (Signed)
[facility]    CSN: [id number] Arrival date & time: 12/18/21  4332      History   Chief Complaint Chief Complaint  Patient presents with   URI    HPI Brett [name] is a 51 y.o. male.   Patient here today for evaluation of suspected upper respiratory infection.  He has had some cough and congestion for the last 4 days.  Initially he reports some ear discomfort with swallowing and throat discomfort due to PND but this has improved.  He has reported some fever at times.  He has tried taking DayQuil NyQuil and Robitussin with some relief but not resolution.  Cough seems to be the worst symptom that is lingering.  The history is provided by the patient.   Past Medical History:  Diagnosis Date   Arthritis    neck   Cancer ([place]) 10/21/2004   Renal Cell carcinoma (right)   GERD (gastroesophageal reflux disease)     Patient Active Problem List   Diagnosis Date Noted   History of Renal Cell Carcinoma 03/06/2013    Past Surgical History:  Procedure Laterality Date   CHOLECYSTECTOMY  05/2020   INCISIONAL HERNIA REPAIR N/A 04/18/2021   Procedure: LAPAROSCOPIC INCISIONAL HERNIA REPAIR;  Surgeon: Ralene Ok, MD;  Location: [facility];  Service: General;  Laterality: N/A;   INSERTION OF MESH  04/18/2021   Procedure: INSERTION OF MESH;  Surgeon: Ralene Ok, MD;  Location: [facility];  Service: General;;   NEPHRECTOMY  2006   RIGHT; Renal Cell Carcinoma       Home Medications    Prior to Admission medications   Medication Sig Start Date End Date Taking? Authorizing Provider  pantoprazole (PROTONIX) 40 MG tablet Take 40 mg by mouth every morning. 01/08/21  Yes [provider]  promethazine-dextromethorphan (PROMETHAZINE-DM) 6.25-15 MG/5ML syrup Take 5 mLs by mouth 4 (four) times daily as needed for cough. 12/18/21  Yes Francene Finders, PA-C  acetaminophen (TYLENOL) 500 MG tablet Take 1,000 mg by mouth every 8 (eight) hours as needed for moderate pain.     [provider]  fexofenadine (ALLEGRA) 180 MG tablet Take 180 mg by mouth daily.    [provider]  traMADol (ULTRAM) 50 MG tablet Take 1 tablet (50 mg total) by mouth every 6 (six) hours as needed. 04/18/21 04/18/22  Ralene Ok, MD    Family History Family History  Problem Relation Age of Onset   Healthy Mother     Social History Social History   Tobacco Use   Smoking status: Never   Smokeless tobacco: Never  Vaping Use   Vaping Use: Never used  Substance Use Topics   Alcohol use: No   Drug use: No     Allergies   Patient has no known allergies.   Review of Systems Review of Systems  Constitutional:  Positive for fever.  HENT:  Positive for congestion. Negative for ear pain and sore throat.   Eyes:  Negative for discharge and redness.  Respiratory:  Positive for cough. Negative for shortness of breath.   Gastrointestinal:  Negative for abdominal pain, diarrhea, nausea and vomiting.    Physical Exam Triage Vital Signs ED Triage Vitals  Enc Vitals Group     BP      Pulse      Resp      Temp      Temp src      SpO2      Weight  Height      Head Circumference      Peak Flow      Pain Score      Pain Loc      Pain Edu?      Excl. in [place]?    No data found.  Updated Vital Signs BP (!) 152/81 (BP Location: Left Arm)    Pulse (!) 104    Temp 98.8 F (37.1 C) (Oral)    Ht 6\' 2"  (1.88 m)    Wt 295 lb (133.8 kg)    SpO2 92%    BMI 37.88 kg/m      Physical Exam Vitals and nursing note reviewed.  Constitutional:      General: He is not in acute distress.    Appearance: Normal appearance. He is not ill-appearing.  HENT:     Head: Normocephalic and atraumatic.     Right Ear: Tympanic membrane normal.     Left Ear: Tympanic membrane normal.     Nose: Congestion (mild) present.     Mouth/Throat:     Mouth: Mucous membranes are moist.     Pharynx: Oropharynx is clear. No oropharyngeal exudate or posterior oropharyngeal erythema.   Eyes:     Conjunctiva/sclera: Conjunctivae normal.  Cardiovascular:     Rate and Rhythm: Normal rate and regular rhythm.     Heart sounds: Normal heart sounds. No murmur heard. Pulmonary:     Effort: Pulmonary effort is normal. No respiratory distress.     Breath sounds: Normal breath sounds. No wheezing, rhonchi or rales.  Skin:    General: Skin is warm and dry.  Neurological:     Mental Status: He is alert.  Psychiatric:        Mood and Affect: Mood normal.        Thought Content: Thought content normal.     UC Treatments / Results  Labs (all labs ordered are listed, but only abnormal results are displayed) Labs Reviewed  COVID-19, FLU A+B NAA    EKG   Radiology No results found.  Procedures Procedures (including critical care time)  Medications Ordered in UC Medications - No data to display  Initial Impression / Assessment and Plan / UC Course  I have reviewed the triage vital signs and the nursing notes.  Pertinent labs & imaging results that were available during my care of the patient were reviewed by me and considered in my medical decision making (see chart for details).    Suspect viral etiology of symptoms.  Will screen for COVID and flu.  Promethazine DM prescribed to hopefully help with cough.  Encouraged follow-up if symptoms fail to improve or worsen.  Final Clinical Impressions(s) / UC Diagnoses   Final diagnoses:  Acute upper respiratory infection   Discharge Instructions   None    ED Prescriptions     Medication Sig Dispense Auth. Provider   promethazine-dextromethorphan (PROMETHAZINE-DM) 6.25-15 MG/5ML syrup Take 5 mLs by mouth 4 (four) times daily as needed for cough. 118 mL Francene Finders, PA-C      PDMP not reviewed this encounter.   Francene Finders, PA-C 12/18/21 [id number]

## 2021-12-18 NOTE — ED Triage Notes (Signed)
Patient c/o URI, productive cough, congestion x 4 days.  Some fever, taken Dayquil, Nyquil and Robitussin DM.

## 2021-12-19 LAB — COVID-19, FLU A+B NAA
Influenza A, NAA: NOT DETECTED
Influenza B, NAA: NOT DETECTED
SARS-CoV-2, NAA: NOT DETECTED

## 2022-08-20 IMAGING — CT CT ABD-PELV W/O CM
2 of 4 series · 16 of 46 positions shown, 18 images · non-contrast
Comparison: 05/17/2020

CLINICAL DATA: Abdominal pain

EXAM:
CT ABDOMEN AND PELVIS WITHOUT CONTRAST
TECHNIQUE: Multidetector CT imaging of the abdomen and pelvis was performed
following the standard protocol without IV contrast.

[Series 2: abd pel wo · axial · 0.90mm/px · z∈[+797,+1282]mm · 13 of 107 slices shown, 15 images]
[im 5/107  soft-tissue]
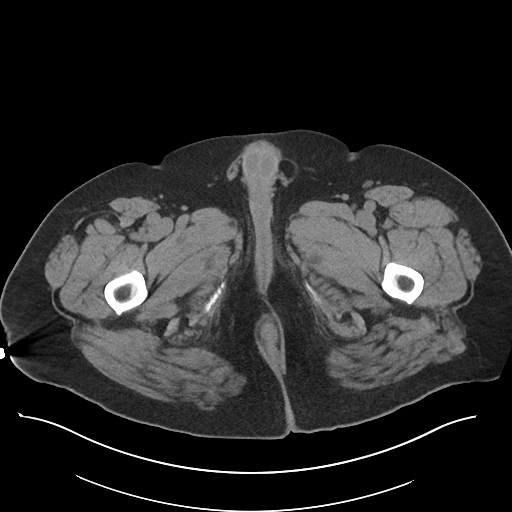
[im 5/107  bone]
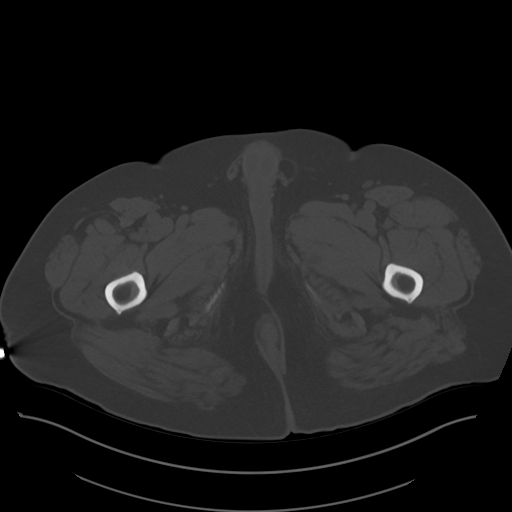
[im 15/107  soft-tissue]
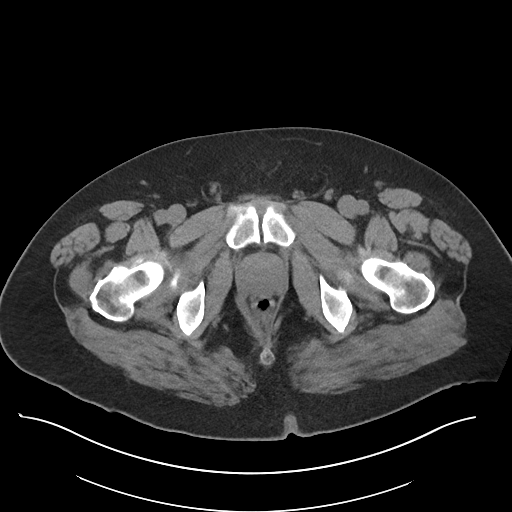
[im 25/107  soft-tissue]
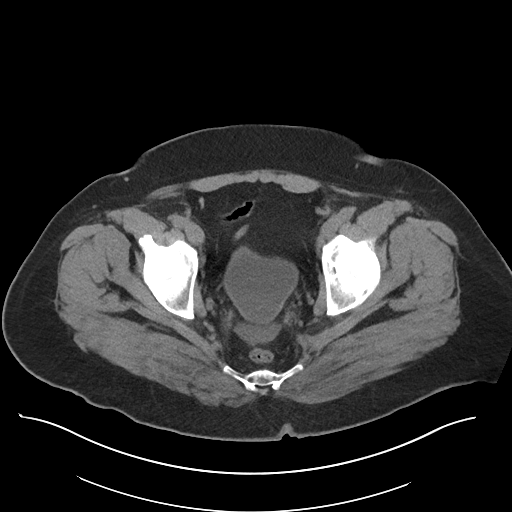
[im 29/107  soft-tissue]
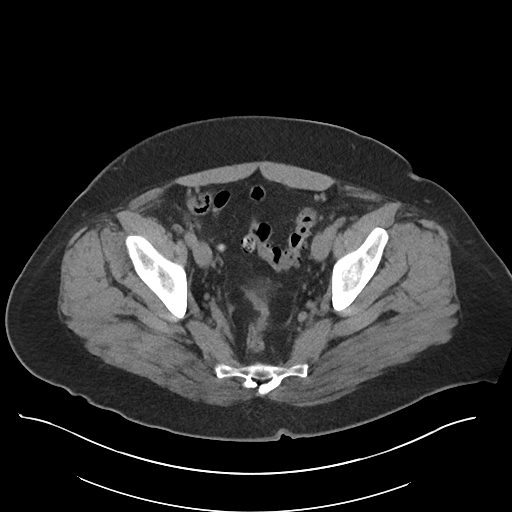
[im 39/107  soft-tissue]
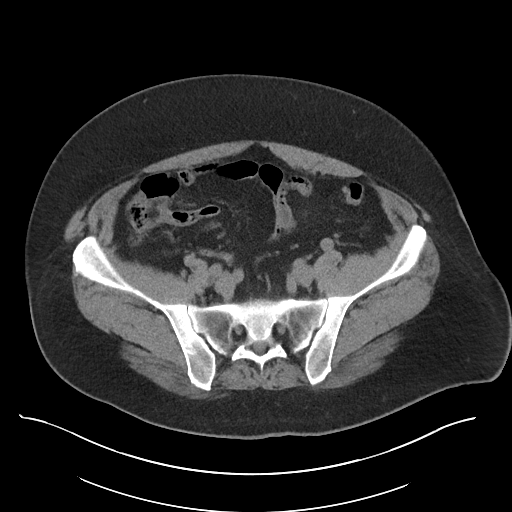
[im 44/107  soft-tissue]
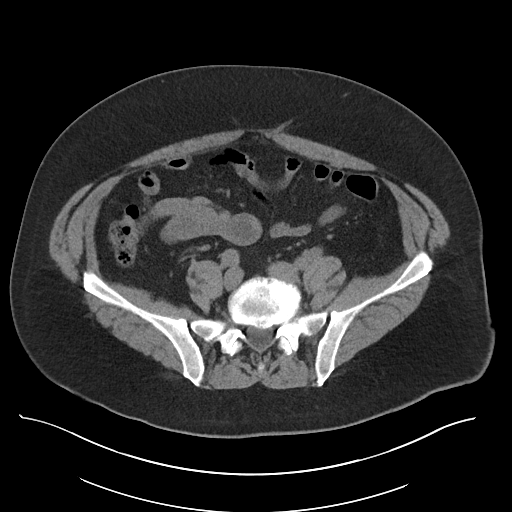
[im 54/107  soft-tissue]
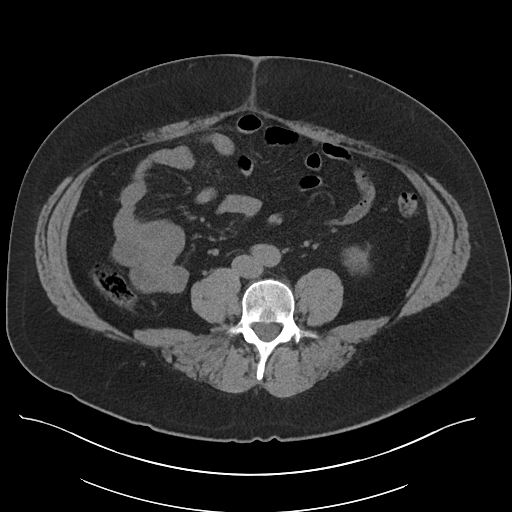
[im 63/107  soft-tissue]
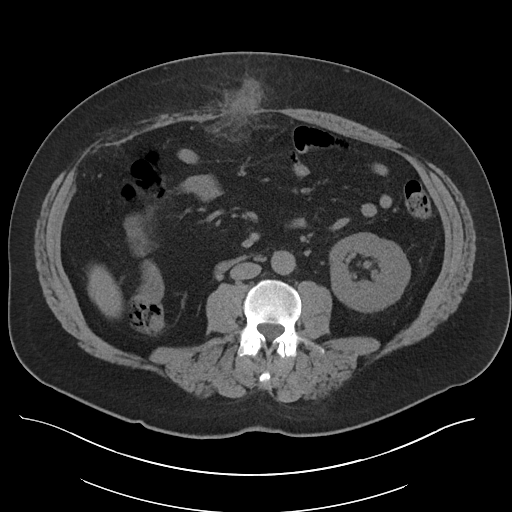
[im 68/107  soft-tissue]
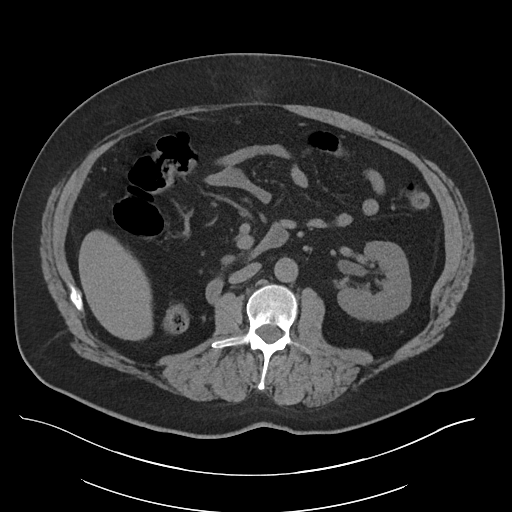
[im 68/107  bone]
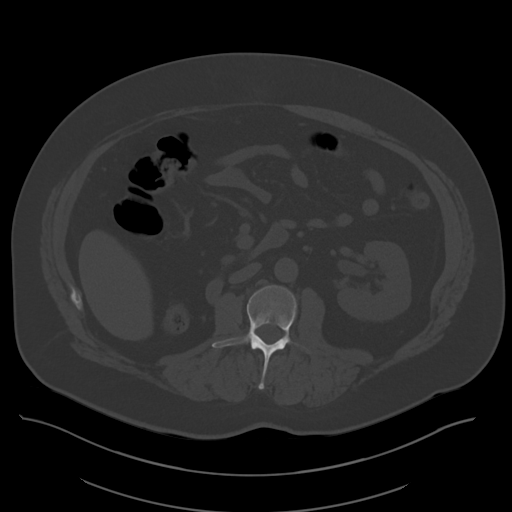
[im 78/107  soft-tissue]
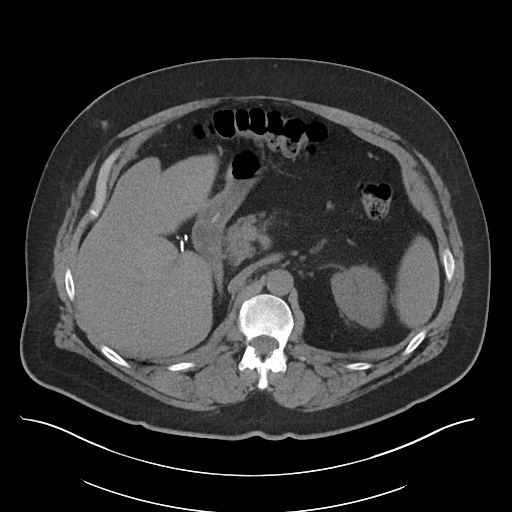
[im 82/107  soft-tissue]
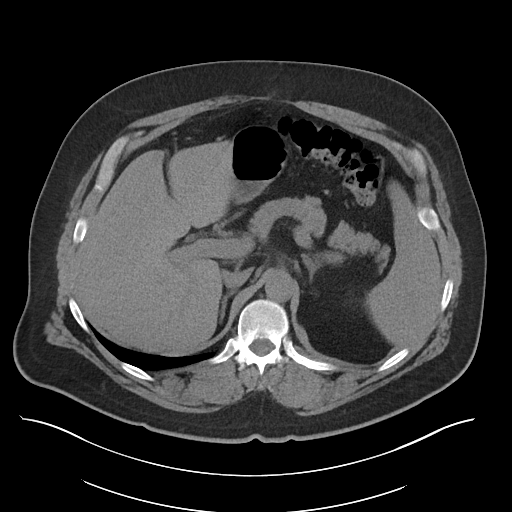
[im 92/107  soft-tissue]
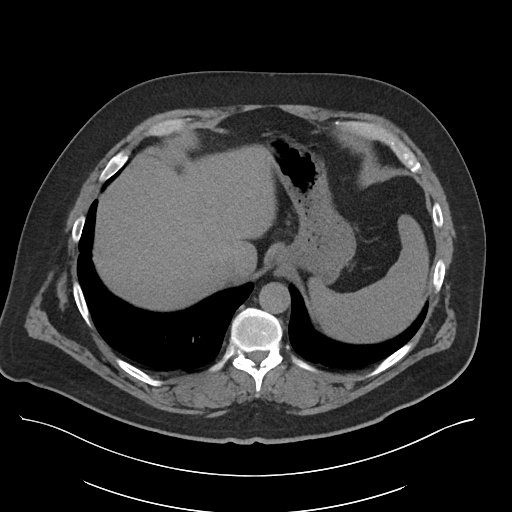
[im 102/107  soft-tissue]
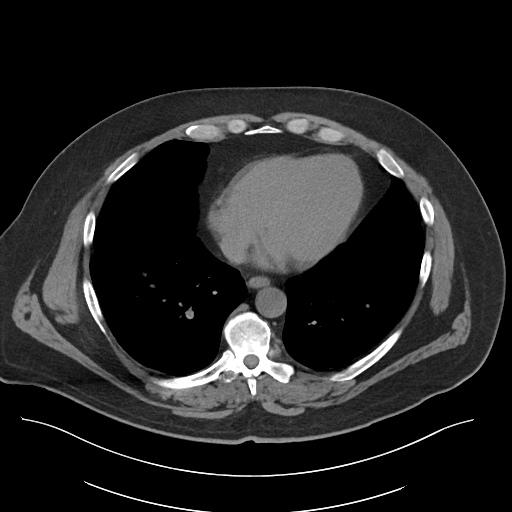

[Series 5: coronal · coronal · 0.90mm/px · 3 of 119 slices shown]
[im 40/119  soft-tissue]
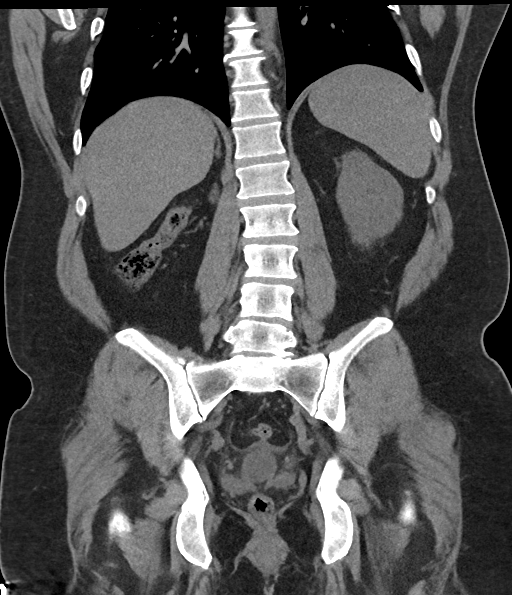
[im 53/119  soft-tissue]
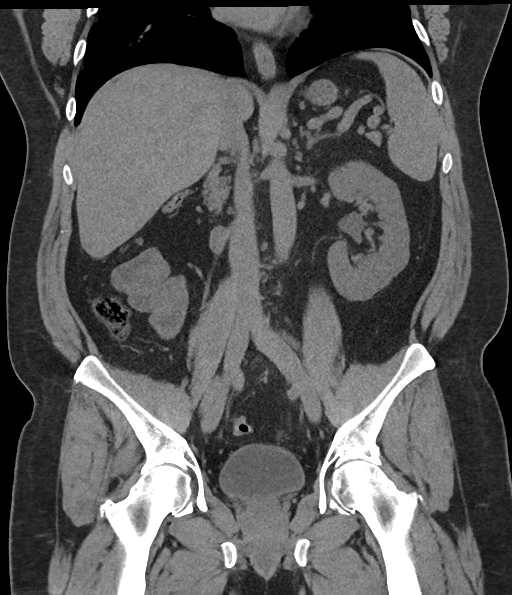
[im 66/119  soft-tissue]
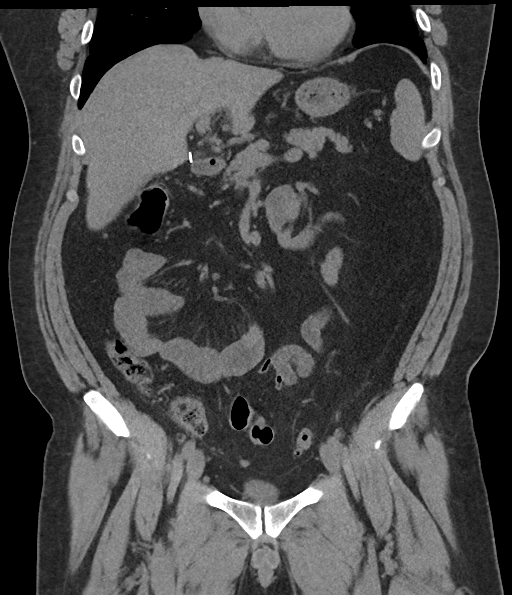

[16 of 46 positions shown; findings below may reference images not displayed]

FINDINGS: Lower chest: No acute abnormality.

Hepatobiliary: Previous cholecystectomy. No focal liver abnormality.
No biliary ductal dilatation.

Pancreas: Unremarkable. No pancreatic ductal dilatation or
surrounding inflammatory changes.

Spleen: Normal in size without focal abnormality.

Adrenals/Urinary Tract: Normal adrenal glands. Right nephrectomy.
Left kidney normal. Urinary bladder is unremarkable.

Stomach/Bowel: Stomach is within normal limits. Appendix appears
normal. No evidence of bowel wall thickening, distention, or
inflammatory changes.

Vascular/Lymphatic: No significant vascular findings are present. No
enlarged abdominal or pelvic lymph nodes.

Reproductive: Prostate is unremarkable.

Other: There is a ventral, midline supraumbilical hernia which
contains fat only. A hyperdense rim with surrounding soft tissue
stranding around the herniated fat consistent with fat necrosis
secondary to presumed incarceration. Fat stranding extends
posteriorly to involve the intra-abdominal portion of the omentum,
image 49/2. Several additional tiny ventral midline hernias are
noted within the more proximal ventral abdominal wall which also
just contain fat.

Fat containing left umbilical hernia.

Musculoskeletal: No acute or significant osseous findings.
IMPRESSION: 1. There is a ventral, midline supraumbilical hernia which contains
fat only. There is a hyperdense rim with surrounding soft tissue
stranding around the herniated fat consistent with fat necrosis
secondary to presumed incarceration. Fat stranding into the
intra-abdominal portion of the omentum.
2. Status post cholecystectomy and right nephrectomy.
